# Patient Record
Sex: Female | Born: 1965 | Race: White | Hispanic: No | Smoking: Current every day smoker
Health system: Southern US, Community
[De-identification: ages and names within clinical notes are randomized; demographics above are authoritative.]

## PROBLEM LIST (undated history)

## (undated) DIAGNOSIS — K859 Acute pancreatitis without necrosis or infection, unspecified: Secondary | ICD-10-CM

## (undated) DIAGNOSIS — F32A Depression, unspecified: Secondary | ICD-10-CM

## (undated) DIAGNOSIS — I10 Essential (primary) hypertension: Secondary | ICD-10-CM

## (undated) DIAGNOSIS — J45909 Unspecified asthma, uncomplicated: Secondary | ICD-10-CM

## (undated) DIAGNOSIS — M797 Fibromyalgia: Secondary | ICD-10-CM

## (undated) DIAGNOSIS — F329 Major depressive disorder, single episode, unspecified: Secondary | ICD-10-CM

## (undated) DIAGNOSIS — F4310 Post-traumatic stress disorder, unspecified: Secondary | ICD-10-CM

## (undated) HISTORY — PX: OTHER SURGICAL HISTORY: SHX169

## (undated) HISTORY — PX: BREAST SURGERY: SHX581

## (undated) HISTORY — PX: ABDOMINAL HYSTERECTOMY: SHX81

---

## 2007-03-22 ENCOUNTER — Emergency Department (HOSPITAL_COMMUNITY): Admission: EM | Admit: 2007-03-22 | Discharge: 2007-03-22 | Payer: Self-pay | Admitting: Emergency Medicine

## 2011-12-07 DIAGNOSIS — R609 Edema, unspecified: Secondary | ICD-10-CM

## 2012-10-22 ENCOUNTER — Encounter (HOSPITAL_COMMUNITY): Payer: Self-pay | Admitting: *Deleted

## 2012-10-22 ENCOUNTER — Emergency Department (HOSPITAL_COMMUNITY)
Admission: EM | Admit: 2012-10-22 | Discharge: 2012-10-23 | Disposition: A | Discharge status: Home / Self Care | Payer: BC Managed Care – PPO | Attending: Emergency Medicine | Admitting: Emergency Medicine

## 2012-10-22 DIAGNOSIS — W57XXXA Bitten or stung by nonvenomous insect and other nonvenomous arthropods, initial encounter: Secondary | ICD-10-CM

## 2012-10-22 DIAGNOSIS — R21 Rash and other nonspecific skin eruption: Secondary | ICD-10-CM | POA: Insufficient documentation

## 2012-10-22 DIAGNOSIS — F3289 Other specified depressive episodes: Secondary | ICD-10-CM | POA: Insufficient documentation

## 2012-10-22 DIAGNOSIS — Y9389 Activity, other specified: Secondary | ICD-10-CM | POA: Insufficient documentation

## 2012-10-22 DIAGNOSIS — F17200 Nicotine dependence, unspecified, uncomplicated: Secondary | ICD-10-CM | POA: Insufficient documentation

## 2012-10-22 DIAGNOSIS — I10 Essential (primary) hypertension: Secondary | ICD-10-CM | POA: Insufficient documentation

## 2012-10-22 DIAGNOSIS — F329 Major depressive disorder, single episode, unspecified: Secondary | ICD-10-CM | POA: Insufficient documentation

## 2012-10-22 DIAGNOSIS — R509 Fever, unspecified: Secondary | ICD-10-CM | POA: Insufficient documentation

## 2012-10-22 DIAGNOSIS — R197 Diarrhea, unspecified: Secondary | ICD-10-CM | POA: Insufficient documentation

## 2012-10-22 DIAGNOSIS — Y929 Unspecified place or not applicable: Secondary | ICD-10-CM | POA: Insufficient documentation

## 2012-10-22 DIAGNOSIS — R109 Unspecified abdominal pain: Secondary | ICD-10-CM | POA: Insufficient documentation

## 2012-10-22 DIAGNOSIS — S90569A Insect bite (nonvenomous), unspecified ankle, initial encounter: Secondary | ICD-10-CM | POA: Insufficient documentation

## 2012-10-22 DIAGNOSIS — Z79899 Other long term (current) drug therapy: Secondary | ICD-10-CM | POA: Insufficient documentation

## 2012-10-22 DIAGNOSIS — R51 Headache: Secondary | ICD-10-CM | POA: Insufficient documentation

## 2012-10-22 DIAGNOSIS — R112 Nausea with vomiting, unspecified: Secondary | ICD-10-CM | POA: Insufficient documentation

## 2012-10-22 HISTORY — DX: Major depressive disorder, single episode, unspecified: F32.9

## 2012-10-22 HISTORY — DX: Depression, unspecified: F32.A

## 2012-10-22 HISTORY — DX: Essential (primary) hypertension: I10

## 2012-10-22 LAB — CBC WITH DIFFERENTIAL/PLATELET
Basophils Relative: 0 % (ref 0–1)
HCT: 32.4 % — ABNORMAL LOW (ref 36.0–46.0)
Hemoglobin: 11.2 g/dL — ABNORMAL LOW (ref 12.0–15.0)
Lymphs Abs: 3.2 10*3/uL (ref 0.7–4.0)
MCH: 29.6 pg (ref 26.0–34.0)
MCHC: 34.6 g/dL (ref 30.0–36.0)
Monocytes Absolute: 0.6 10*3/uL (ref 0.1–1.0)
Monocytes Relative: 8 % (ref 3–12)
Neutro Abs: 3.4 10*3/uL (ref 1.7–7.7)

## 2012-10-22 LAB — COMPREHENSIVE METABOLIC PANEL
Albumin: 4 g/dL (ref 3.5–5.2)
BUN: 13 mg/dL (ref 6–23)
Chloride: 92 mEq/L — ABNORMAL LOW (ref 96–112)
Creatinine, Ser: 0.72 mg/dL (ref 0.50–1.10)
GFR calc Af Amer: >90 mL/min (ref >90)
GFR calc non Af Amer: >90 mL/min (ref >90)
Glucose, Bld: 95 mg/dL (ref 70–99)
Total Bilirubin: 0.2 mg/dL — ABNORMAL LOW (ref 0.3–1.2)

## 2012-10-22 MED ORDER — KETOROLAC TROMETHAMINE 30 MG/ML IJ SOLN
30.0000 mg | Freq: Once | INTRAMUSCULAR | Status: AC
  Administered 2012-10-22: 30 mg via INTRAVENOUS
  Filled 2012-10-22: qty 1

## 2012-10-22 MED ORDER — SODIUM CHLORIDE 0.9 % IV BOLUS (SEPSIS)
1000.0000 mL | Freq: Once | INTRAVENOUS | Status: AC
  Administered 2012-10-22: 1000 mL via INTRAVENOUS

## 2012-10-22 MED ORDER — ONDANSETRON HCL 4 MG/2ML IJ SOLN
4.0000 mg | Freq: Once | INTRAMUSCULAR | Status: AC
  Administered 2012-10-22: 4 mg via INTRAVENOUS
  Filled 2012-10-22: qty 2

## 2012-10-22 NOTE — ED Notes (Signed)
Pt states she has n/v/d/ and headache, rash on legs and says she has gotten "80" deer ticks off of her in a period of 3 wks.

## 2012-10-22 NOTE — ED Provider Notes (Signed)
History    This chart was scribed for Kendra Lyons, MD by Toya Smothers, ED Scribe. The patient was seen in room APA05/APA05. Patient's care was started at 2128.  CSN: [id number]  Arrival date & time 10/22/12  2128   First MD Initiated Contact with Patient 10/22/12 2305      Chief Complaint  Patient presents with  . Headache  . Nausea  . Emesis  . Diarrhea  . Rash  . Insect Bite  . Abdominal Pain   Patient is a 47 y.o. female presenting with headaches, vomiting, diarrhea, rash, and abdominal pain. The history is provided by the patient. No language interpreter was used.  Headache Associated symptoms: abdominal pain, diarrhea, fever, nausea and vomiting   Emesis Associated symptoms: abdominal pain, diarrhea and headaches   Diarrhea Associated symptoms: abdominal pain, fever, headaches and vomiting   Rash Associated symptoms: diarrhea, fever, nausea and vomiting   Abdominal Pain Associated symptoms include abdominal pain and headaches.    HPI Comments:  Kendra Cox is a 47 y.o. female with h/o HTN and depression, who presents to the Emergency Department complaining of new gradual onset, constant, progressive rash to lower extremities and shoulders. Pt states that she removed "80 ticks from her skin." Pt now c/o 1 week of gradual onset, constant, moderate nausea, vomiting, diarrhea, and generalized diffuse abdominal pain. She also complains of Fever 103.6 yesterday. Pt denotes exposure to wooded area behind her mothers house. Pt denies headache, diaphoresis,weakness, cough, SOB and any other pain. Pt is a current everyday smoker, denying alcohol and illicit drug use. Pt lives with sister.    Past Medical History  Diagnosis Date  . Hypertension   . Depression     Past Surgical History  Procedure Laterality Date  . Abdominal hysterectomy    . Thumb surgery      No family history on file.  History  Substance Use Topics  . Smoking status: Current Every Day Smoker  .  Smokeless tobacco: Not on file  . Alcohol Use: No    Review of Systems  Constitutional: Positive for fever.  Gastrointestinal: Positive for nausea, vomiting, abdominal pain and diarrhea.  Skin: Positive for rash.  Neurological: Positive for headaches.  All other systems reviewed and are negative.    Allergies  Sulfa antibiotics  Home Medications   Current Outpatient Rx  Name  Route  Sig  Dispense  Refill  . ALPRAZolam (XANAX) 1 MG tablet   Oral   Take 1 mg by mouth daily as needed for sleep or anxiety.         Marland Kitchen BIOTIN PO   Oral   Take 1 capsule by mouth at bedtime.         . Calcium Carbonate-Vitamin D (CALCIUM + D PO)   Oral   Take 1 tablet by mouth at bedtime.         Marland Kitchen LISINOPRIL-HYDROCHLOROTHIAZIDE PO   Oral   Take 1 tablet by mouth every morning.         . venlafaxine XR (EFFEXOR-XR) 150 MG 24 hr capsule   Oral   Take 150 mg by mouth every morning.         . venlafaxine XR (EFFEXOR-XR) 75 MG 24 hr capsule   Oral   Take 75 mg by mouth every morning.           BP 134/90  Pulse 94  Temp(Src) 98.4 F (36.9 C) (Oral)  Resp 16  Ht 5\' 4"  (1.626  m)  Wt 129 lb (58.514 kg)  BMI 22.13 kg/m2  SpO2 98%  Physical Exam  Nursing note and vitals reviewed. Constitutional: She is oriented to person, place, and time. She appears well-developed and well-nourished. No distress.  HENT:  Head: Normocephalic and atraumatic.  Mouth/Throat: No oropharyngeal exudate.  Eyes: Conjunctivae and EOM are normal. Pupils are equal, round, and reactive to light. Right eye exhibits no discharge. Left eye exhibits no discharge.  Neck: Normal range of motion. Neck supple. No tracheal deviation present.  Cardiovascular: Normal rate.   Pulmonary/Chest: Effort normal. No respiratory distress. She has no wheezes.  Abdominal: Soft. She exhibits no distension. There is no tenderness.  Musculoskeletal: Normal range of motion. She exhibits no edema.  Lymphadenopathy:    She has  no cervical adenopathy.  Neurological: She is alert and oriented to person, place, and time. No cranial nerve deficit.  Skin: Skin is warm and dry.  There are numerous lesions to both lower extremities. These appear to be insect bites.  Psychiatric: She has a normal mood and affect. Her behavior is normal.    ED Course  Procedures  DIAGNOSTIC STUDIES: Oxygen Saturation is 98% on room air, normal by my interpretation.    COORDINATION OF CARE: 23:08- Evaluated Pt. Pt is awake, alert, and without distress. 23:12- Patient understands and agrees with initial ED impression and plan with expectations set for ED visit.   Labs Reviewed  CBC WITH DIFFERENTIAL - Abnormal; Notable for the following:    RBC 3.79 (*)    Hemoglobin 11.2 (*)    HCT 32.4 (*)    All other components within normal limits  COMPREHENSIVE METABOLIC PANEL  B. BURGDORFI ANTIBODIES   No results found.   No diagnosis found.    MDM  The patient insists the numerous small lesions on the legs are tick bites and that she pulled this number of ticks off of her.  These do not appear petechial and I am unable to find any residual ticks in any of these lesions.  I will prescribe her doxycycline due to the number of bites and have ordered Lymes titers.  She will be discharged to home.        I personally performed the services described in this documentation, which was scribed in my presence. The recorded information has been reviewed and is accurate.      Kendra Lyons, MD 10/23/12 [phone]

## 2012-10-23 MED ORDER — DOXYCYCLINE HYCLATE 100 MG PO CAPS: 100.0000 mg | ORAL_CAPSULE | Freq: Two times a day (BID) | ORAL | Status: DC

## 2012-10-23 NOTE — ED Notes (Signed)
Patient states she feels better and is requesting to go home. Advised MD.

## 2012-10-23 NOTE — ED Notes (Signed)
Patient has had no vomiting since arrival to ED. Gave patient ginger ale to drink as requested.

## 2012-10-25 LAB — B. BURGDORFI ANTIBODIES: B burgdorferi Ab IgG+IgM: 0.22 {ISR}

## 2013-01-17 ENCOUNTER — Emergency Department (HOSPITAL_COMMUNITY)
Admission: EM | Admit: 2013-01-17 | Discharge: 2013-01-17 | Disposition: A | Discharge status: Home / Self Care | Payer: BC Managed Care – PPO | Attending: Emergency Medicine | Admitting: Emergency Medicine

## 2013-01-17 ENCOUNTER — Encounter (HOSPITAL_COMMUNITY): Payer: Self-pay | Admitting: *Deleted

## 2013-01-17 DIAGNOSIS — R52 Pain, unspecified: Secondary | ICD-10-CM | POA: Insufficient documentation

## 2013-01-17 DIAGNOSIS — F329 Major depressive disorder, single episode, unspecified: Secondary | ICD-10-CM | POA: Insufficient documentation

## 2013-01-17 DIAGNOSIS — I10 Essential (primary) hypertension: Secondary | ICD-10-CM | POA: Insufficient documentation

## 2013-01-17 DIAGNOSIS — Z79899 Other long term (current) drug therapy: Secondary | ICD-10-CM | POA: Insufficient documentation

## 2013-01-17 DIAGNOSIS — G8929 Other chronic pain: Secondary | ICD-10-CM | POA: Insufficient documentation

## 2013-01-17 DIAGNOSIS — R51 Headache: Secondary | ICD-10-CM | POA: Insufficient documentation

## 2013-01-17 DIAGNOSIS — R42 Dizziness and giddiness: Secondary | ICD-10-CM | POA: Insufficient documentation

## 2013-01-17 DIAGNOSIS — Z760 Encounter for issue of repeat prescription: Secondary | ICD-10-CM | POA: Insufficient documentation

## 2013-01-17 DIAGNOSIS — F3289 Other specified depressive episodes: Secondary | ICD-10-CM | POA: Insufficient documentation

## 2013-01-17 DIAGNOSIS — F17200 Nicotine dependence, unspecified, uncomplicated: Secondary | ICD-10-CM | POA: Insufficient documentation

## 2013-01-17 MED ORDER — LORAZEPAM 1 MG PO TABS: ORAL_TABLET | ORAL | Status: DC

## 2013-01-17 MED ORDER — LISINOPRIL-HYDROCHLOROTHIAZIDE 10-12.5 MG PO TABS: 1.0000 | ORAL_TABLET | Freq: Every day | ORAL | Status: DC
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
## 2013-01-17 NOTE — ED Provider Notes (Signed)
CSN: [id number]     Arrival date & time 01/17/13  1312 History   First MD Initiated Contact with Patient 01/17/13 1343     Chief Complaint  Patient presents with  . Medication Refill    HPI Patient presents for medication refill She reports her physician lost license and can not get meds refilled No fever/vomiting, but she reports body aches and headaches and dizziness   Past Medical History  Diagnosis Date  . Hypertension   . Depression    Past Surgical History  Procedure Laterality Date  . Abdominal hysterectomy    . Thumb surgery    . Breast surgery     History reviewed. No pertinent family history. History  Substance Use Topics  . Smoking status: Current Every Day Smoker  . Smokeless tobacco: Not on file  . Alcohol Use: No   OB History   Grav Para Term Preterm Abortions TAB SAB Ect Mult Living                 Review of Systems  Constitutional: Negative for fever.  Gastrointestinal: Negative for vomiting.    Allergies  Sulfa antibiotics  Home Medications   Current Outpatient Rx  Name  Route  Sig  Dispense  Refill  . ALPRAZolam (XANAX) 1 MG tablet   Oral   Take 1 mg by mouth daily as needed for sleep or anxiety.         Marland Kitchen BIOTIN PO   Oral   Take 1 capsule by mouth at bedtime.         . Calcium Carbonate-Vitamin D (CALCIUM + D PO)   Oral   Take 1 tablet by mouth at bedtime.         Marland Kitchen doxycycline (VIBRAMYCIN) 100 MG capsule   Oral   Take 1 capsule (100 mg total) by mouth 2 (two) times daily.   28 capsule   0   . lisinopril-hydrochlorothiazide (PRINZIDE) 10-12.5 MG per tablet   Oral   Take 1 tablet by mouth daily.   14 tablet   0   . LISINOPRIL-HYDROCHLOROTHIAZIDE PO   Oral   Take 1 tablet by mouth every morning.         Marland Kitchen LORazepam (ATIVAN) 1 MG tablet      Take one tablet three times a day for 2 days, then take one tablet twice a day for 2 days, then take one tablet daily for 2 days then stop   12 tablet   0   . venlafaxine XR  (EFFEXOR-XR) 150 MG 24 hr capsule   Oral   Take 150 mg by mouth every morning.         . venlafaxine XR (EFFEXOR-XR) 75 MG 24 hr capsule   Oral   Take 75 mg by mouth every morning.          BP 134/87  Pulse 87  Temp(Src) 98.2 F (36.8 C) (Oral)  Resp 17  Ht 5\' 4"  (1.626 m)  Wt 129 lb (58.514 kg)  BMI 22.13 kg/m2  SpO2 94% Physical Exam CONSTITUTIONAL: Well developed/well nourished, pt wearing sunglasses HEAD: Normocephalic/atraumatic EYES: EOMI/PERRL ENMT: Mucous membranes moist NECK: supple no meningeal signs SPINE:entire spine nontender CV: S1/S2 noted, no murmurs/rubs/gallops noted LUNGS: Lungs are clear to auscultation bilaterally, no apparent distress ABDOMEN: soft, nontender, no rebound or guarding NEURO: Pt is awake/alert, moves all extremitiesx4, no arm/leg drift.  No ataxia EXTREMITIES: pulses normal, full ROM SKIN: warm, color normal PSYCH: no abnormalities of mood  noted  ED Course  Procedures (including critical care time)   MDM   1. Chronic pain    Nursing notes including past medical history and social history reviewed and considered in documentation Narcotic database reviewed  Will give short course of ativan to taper to prevent withdrawal Also her HTN meds were prescribed Advised that emergency department can not refill her chronic pain meds     Joya Gaskins, MD 01/17/13 1454

## 2015-03-26 ENCOUNTER — Emergency Department (HOSPITAL_COMMUNITY)
Admission: EM | Admit: 2015-03-26 | Discharge: 2015-03-26 | Disposition: A | Discharge status: Home / Self Care | Payer: Self-pay | Attending: Emergency Medicine | Admitting: Emergency Medicine

## 2015-03-26 ENCOUNTER — Encounter (HOSPITAL_COMMUNITY): Payer: Self-pay | Admitting: Emergency Medicine

## 2015-03-26 ENCOUNTER — Emergency Department (HOSPITAL_COMMUNITY): Payer: Self-pay

## 2015-03-26 DIAGNOSIS — Z79899 Other long term (current) drug therapy: Secondary | ICD-10-CM | POA: Insufficient documentation

## 2015-03-26 DIAGNOSIS — Y998 Other external cause status: Secondary | ICD-10-CM | POA: Insufficient documentation

## 2015-03-26 DIAGNOSIS — M549 Dorsalgia, unspecified: Secondary | ICD-10-CM

## 2015-03-26 DIAGNOSIS — I10 Essential (primary) hypertension: Secondary | ICD-10-CM | POA: Insufficient documentation

## 2015-03-26 DIAGNOSIS — L0231 Cutaneous abscess of buttock: Secondary | ICD-10-CM | POA: Insufficient documentation

## 2015-03-26 DIAGNOSIS — S0990XA Unspecified injury of head, initial encounter: Secondary | ICD-10-CM | POA: Insufficient documentation

## 2015-03-26 DIAGNOSIS — S3991XA Unspecified injury of abdomen, initial encounter: Secondary | ICD-10-CM | POA: Insufficient documentation

## 2015-03-26 DIAGNOSIS — Z720 Tobacco use: Secondary | ICD-10-CM | POA: Insufficient documentation

## 2015-03-26 DIAGNOSIS — Y9389 Activity, other specified: Secondary | ICD-10-CM | POA: Insufficient documentation

## 2015-03-26 DIAGNOSIS — F329 Major depressive disorder, single episode, unspecified: Secondary | ICD-10-CM | POA: Insufficient documentation

## 2015-03-26 DIAGNOSIS — S3992XA Unspecified injury of lower back, initial encounter: Secondary | ICD-10-CM | POA: Insufficient documentation

## 2015-03-26 DIAGNOSIS — R197 Diarrhea, unspecified: Secondary | ICD-10-CM | POA: Insufficient documentation

## 2015-03-26 DIAGNOSIS — Y92410 Unspecified street and highway as the place of occurrence of the external cause: Secondary | ICD-10-CM | POA: Insufficient documentation

## 2015-03-26 LAB — CBC WITH DIFFERENTIAL/PLATELET
BASOS PCT: 0 %
Basophils Absolute: 0 10*3/uL (ref 0.0–0.1)
EOS ABS: 0.2 10*3/uL (ref 0.0–0.7)
Eosinophils Relative: 3 %
HEMATOCRIT: 32.6 % — AB (ref 36.0–46.0)
Hemoglobin: 11 g/dL — ABNORMAL LOW (ref 12.0–15.0)
Lymphocytes Relative: 29 %
Lymphs Abs: 2.4 10*3/uL (ref 0.7–4.0)
MCH: 29.9 pg (ref 26.0–34.0)
MCHC: 33.7 g/dL (ref 30.0–36.0)
MCV: 88.6 fL (ref 78.0–100.0)
MONO ABS: 0.6 10*3/uL (ref 0.1–1.0)
MONOS PCT: 7 %
NEUTROS ABS: 5.2 10*3/uL (ref 1.7–7.7)
Neutrophils Relative %: 61 %
Platelets: 300 10*3/uL (ref 150–400)
RBC: 3.68 MIL/uL — ABNORMAL LOW (ref 3.87–5.11)
RDW: 13.5 % (ref 11.5–15.5)
WBC: 8.5 10*3/uL (ref 4.0–10.5)

## 2015-03-26 LAB — BASIC METABOLIC PANEL
Anion gap: 7 (ref 5–15)
BUN: 7 mg/dL (ref 6–20)
CALCIUM: 9.1 mg/dL (ref 8.9–10.3)
CO2: 29 mmol/L (ref 22–32)
CREATININE: 0.64 mg/dL (ref 0.44–1.00)
Chloride: 99 mmol/L — ABNORMAL LOW (ref 101–111)
GFR calc non Af Amer: >60 mL/min (ref >60)
Glucose, Bld: 93 mg/dL (ref 65–99)
Potassium: 3.8 mmol/L (ref 3.5–5.1)
Sodium: 135 mmol/L (ref 135–145)

## 2015-03-26 LAB — HEPATIC FUNCTION PANEL
ALT: 18 U/L (ref 14–54)
AST: 22 U/L (ref 15–41)
Albumin: 3.8 g/dL (ref 3.5–5.0)
Alkaline Phosphatase: 65 U/L (ref 38–126)
BILIRUBIN INDIRECT: 0.2 mg/dL — AB (ref 0.3–0.9)
Bilirubin, Direct: 0.1 mg/dL (ref 0.1–0.5)
Total Bilirubin: 0.3 mg/dL (ref 0.3–1.2)
Total Protein: 6.2 g/dL — ABNORMAL LOW (ref 6.5–8.1)

## 2015-03-26 MED ORDER — METHOCARBAMOL 500 MG PO TABS: 500.0000 mg | ORAL_TABLET | Freq: Three times a day (TID) | ORAL | Status: DC

## 2015-03-26 MED ORDER — LIDOCAINE HCL (PF) 2 % IJ SOLN
INTRAMUSCULAR | Status: AC
  Filled 2015-03-26: qty 10

## 2015-03-26 MED ORDER — CLINDAMYCIN HCL 150 MG PO CAPS: 300.0000 mg | ORAL_CAPSULE | Freq: Four times a day (QID) | ORAL | Status: DC

## 2015-03-26 MED ORDER — OXYCODONE-ACETAMINOPHEN 5-325 MG PO TABS: 1.0000 | ORAL_TABLET | ORAL | Status: DC | PRN

## 2015-03-26 MED ORDER — OXYCODONE-ACETAMINOPHEN 5-325 MG PO TABS
1.0000 | ORAL_TABLET | Freq: Once | ORAL | Status: AC
  Administered 2015-03-26: 1 via ORAL
  Filled 2015-03-26: qty 1

## 2015-03-26 NOTE — Discharge Instructions (Signed)
Abscess An abscess (boil or furuncle) is an infected area on or under the skin. This area is filled with yellowish-white fluid (pus) and other material (debris). HOME CARE   Only take medicines as told by your doctor.  If you were given antibiotic medicine, take it as directed. Finish the medicine even if you start to feel better.  If gauze is used, follow your doctor's directions for changing the gauze.  To avoid spreading the infection:  Keep your abscess covered with a bandage.  Wash your hands well.  Do not share personal care items, towels, or whirlpools with others.  Avoid skin contact with others.  Keep your skin and clothes clean around the abscess.  Keep all doctor visits as told. GET HELP RIGHT AWAY IF:   You have more pain, puffiness (swelling), or redness in the wound site.  You have more fluid or blood coming from the wound site.  You have muscle aches, chills, or you feel sick.  You have a fever. MAKE SURE YOU:   Understand these instructions.  Will watch your condition.  Will get help right away if you are not doing well or get worse.   This information is not intended to replace advice given to you by your health care provider. Make sure you discuss any questions you have with your health care provider.   Document Released: 10/22/2007 Document Revised: 11/04/2011 Document Reviewed: 07/19/2011 Elsevier Interactive Patient Education 2016 Elsevier Inc.  Back Pain, Adult Back pain is very common in adults.The cause of back pain is rarely dangerous and the pain often gets better over time.The cause of your back pain may not be known. Some common causes of back pain include:  Strain of the muscles or ligaments supporting the spine.  Wear and tear (degeneration) of the spinal disks.  Arthritis.  Direct injury to the back. For many people, back pain may return. Since back pain is rarely dangerous, most people can learn to manage this condition on  their own. HOME CARE INSTRUCTIONS Watch your back pain for any changes. The following actions may help to lessen any discomfort you are feeling:  Remain active. It is stressful on your back to sit or stand in one place for long periods of time. Do not sit, drive, or stand in one place for more than 30 minutes at a time. Take short walks on even surfaces as soon as you are able.Try to increase the length of time you walk each day.  Exercise regularly as directed by your health care provider. Exercise helps your back heal faster. It also helps avoid future injury by keeping your muscles strong and flexible.  Do not stay in bed.Resting more than 1-2 days can delay your recovery.  Pay attention to your body when you bend and lift. The most comfortable positions are those that put less stress on your recovering back. Always use proper lifting techniques, including:  Bending your knees.  Keeping the load close to your body.  Avoiding twisting.  Find a comfortable position to sleep. Use a firm mattress and lie on your side with your knees slightly bent. If you lie on your back, put a pillow under your knees.  Avoid feeling anxious or stressed.Stress increases muscle tension and can worsen back pain.It is important to recognize when you are anxious or stressed and learn ways to manage it, such as with exercise.  Take medicines only as directed by your health care provider. Over-the-counter medicines to reduce pain and inflammation  are often the most helpful.Your health care provider may prescribe muscle relaxant drugs.These medicines help dull your pain so you can more quickly return to your normal activities and healthy exercise.  Apply ice to the injured area:  Put ice in a plastic bag.  Place a towel between your skin and the bag.  Leave the ice on for 20 minutes, 2-3 times a day for the first 2-3 days. After that, ice and heat may be alternated to reduce pain and spasms.  Maintain a  healthy weight. Excess weight puts extra stress on your back and makes it difficult to maintain good posture. SEEK MEDICAL CARE IF:  You have pain that is not relieved with rest or medicine.  You have increasing pain going down into the legs or buttocks.  You have pain that does not improve in one week.  You have night pain.  You lose weight.  You have a fever or chills. SEEK IMMEDIATE MEDICAL CARE IF:   You develop new bowel or bladder control problems.  You have unusual weakness or numbness in your arms or legs.  You develop nausea or vomiting.  You develop abdominal pain.  You feel faint.   This information is not intended to replace advice given to you by your health care provider. Make sure you discuss any questions you have with your health care provider.   Document Released: 05/05/2005 Document Revised: 05/26/2014 Document Reviewed: 09/06/2013 Elsevier Interactive Patient Education 2016 Elsevier Inc.   [facility] Doctor List    Kari Baars MD. Specialty: Pulmonary Disease Contact information: [address]  [address]  [place] [address]  [phone]   Syliva Overman, MD. Specialty: [facility] Medicine Contact information: [address]  [place] [address]  [phone]   Lilyan Punt, MD. Specialty: [facility] Medicine Contact information: [address]  [place] [address]  [phone]   Avon Gully, MD Specialty: Internal Medicine Contact information: [address] [place] [address]  [phone]   Catalina Pizza, MD. Specialty: Internal Medicine Contact information: [address]  [place] [address]  [phone]   Butch Penny, MD. Specialty: Family Medicine Contact information: [address]  [place] [address]  [phone]   John Giovanni, MD. Specialty: [facility] Medicine Contact information: [address]  [address]  [place] [address]  [phone]   Carylon Perches, MD. Specialty: Internal Medicine Contact information: [address]  [address]  [place] [address]  [phone]

## 2015-03-26 NOTE — ED Notes (Signed)
Pt up and independently ambulatory to bathroom.
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
## 2015-03-26 NOTE — ED Notes (Signed)
Pt in Radiology 

## 2015-06-07 ENCOUNTER — Emergency Department (HOSPITAL_COMMUNITY)
Admission: EM | Admit: 2015-06-07 | Discharge: 2015-06-07 | Disposition: A | Discharge status: Home / Self Care | Payer: Self-pay | Attending: Emergency Medicine | Admitting: Emergency Medicine

## 2015-06-07 ENCOUNTER — Encounter (HOSPITAL_COMMUNITY): Payer: Self-pay

## 2015-06-07 DIAGNOSIS — I10 Essential (primary) hypertension: Secondary | ICD-10-CM | POA: Insufficient documentation

## 2015-06-07 DIAGNOSIS — N12 Tubulo-interstitial nephritis, not specified as acute or chronic: Secondary | ICD-10-CM | POA: Insufficient documentation

## 2015-06-07 DIAGNOSIS — F329 Major depressive disorder, single episode, unspecified: Secondary | ICD-10-CM | POA: Insufficient documentation

## 2015-06-07 DIAGNOSIS — F17210 Nicotine dependence, cigarettes, uncomplicated: Secondary | ICD-10-CM | POA: Insufficient documentation

## 2015-06-07 DIAGNOSIS — Z79899 Other long term (current) drug therapy: Secondary | ICD-10-CM | POA: Insufficient documentation

## 2015-06-07 LAB — BASIC METABOLIC PANEL
ANION GAP: 9 (ref 5–15)
BUN: 6 mg/dL (ref 6–20)
CO2: 28 mmol/L (ref 22–32)
Calcium: 9.4 mg/dL (ref 8.9–10.3)
Chloride: 98 mmol/L — ABNORMAL LOW (ref 101–111)
Creatinine, Ser: 0.57 mg/dL (ref 0.44–1.00)
Glucose, Bld: 93 mg/dL (ref 65–99)
POTASSIUM: 3.6 mmol/L (ref 3.5–5.1)
SODIUM: 135 mmol/L (ref 135–145)

## 2015-06-07 LAB — CBC WITH DIFFERENTIAL/PLATELET
BASOS ABS: 0 10*3/uL (ref 0.0–0.1)
Basophils Relative: 0 %
EOS PCT: 2 %
Eosinophils Absolute: 0.3 10*3/uL (ref 0.0–0.7)
HCT: 34.3 % — ABNORMAL LOW (ref 36.0–46.0)
Hemoglobin: 11.4 g/dL — ABNORMAL LOW (ref 12.0–15.0)
LYMPHS PCT: 28 %
Lymphs Abs: 3.4 10*3/uL (ref 0.7–4.0)
MCH: 30 pg (ref 26.0–34.0)
MCHC: 33.2 g/dL (ref 30.0–36.0)
MCV: 90.3 fL (ref 78.0–100.0)
Monocytes Absolute: 0.6 10*3/uL (ref 0.1–1.0)
Monocytes Relative: 5 %
NEUTROS ABS: 7.7 10*3/uL (ref 1.7–7.7)
Neutrophils Relative %: 65 %
PLATELETS: 314 10*3/uL (ref 150–400)
RBC: 3.8 MIL/uL — AB (ref 3.87–5.11)
RDW: 13.3 % (ref 11.5–15.5)
WBC: 12.1 10*3/uL — AB (ref 4.0–10.5)

## 2015-06-07 LAB — URINE MICROSCOPIC-ADD ON

## 2015-06-07 LAB — URINALYSIS, ROUTINE W REFLEX MICROSCOPIC
Bilirubin Urine: NEGATIVE
Glucose, UA: NEGATIVE mg/dL
Ketones, ur: NEGATIVE mg/dL
NITRITE: NEGATIVE
PROTEIN: NEGATIVE mg/dL
Specific Gravity, Urine: <1.005 — ABNORMAL LOW (ref 1.005–1.030)
pH: 6.5 (ref 5.0–8.0)

## 2015-06-07 MED ORDER — ONDANSETRON 4 MG PO TBDP: 4.0000 mg | ORAL_TABLET | Freq: Three times a day (TID) | ORAL | Status: DC | PRN

## 2015-06-07 MED ORDER — ONDANSETRON HCL 4 MG/2ML IJ SOLN: 4.0000 mg | Freq: Once | INTRAMUSCULAR | Status: DC

## 2015-06-07 MED ORDER — MORPHINE SULFATE (PF) 2 MG/ML IV SOLN
4.0000 mg | Freq: Once | INTRAVENOUS | Status: AC
  Administered 2015-06-07: 4 mg via INTRAVENOUS
  Filled 2015-06-07: qty 2

## 2015-06-07 MED ORDER — OXYCODONE-ACETAMINOPHEN 5-325 MG PO TABS: 1.0000 | ORAL_TABLET | Freq: Four times a day (QID) | ORAL | Status: DC | PRN

## 2015-06-07 MED ORDER — CEFTRIAXONE SODIUM 1 G IJ SOLR
1.0000 g | Freq: Once | INTRAMUSCULAR | Status: AC
  Administered 2015-06-07: 1 g via INTRAVENOUS
  Filled 2015-06-07: qty 10

## 2015-06-07 MED ORDER — PHENAZOPYRIDINE HCL 100 MG PO TABS
100.0000 mg | ORAL_TABLET | Freq: Once | ORAL | Status: AC
  Administered 2015-06-07: 100 mg via ORAL
  Filled 2015-06-07: qty 1

## 2015-06-07 MED ORDER — ONDANSETRON HCL 4 MG/2ML IJ SOLN
4.0000 mg | Freq: Once | INTRAMUSCULAR | Status: AC
  Administered 2015-06-07: 4 mg via INTRAVENOUS
  Filled 2015-06-07: qty 2

## 2015-06-07 MED ORDER — OXYCODONE-ACETAMINOPHEN 5-325 MG PO TABS
1.0000 | ORAL_TABLET | Freq: Once | ORAL | Status: AC
  Administered 2015-06-07: 1 via ORAL
  Filled 2015-06-07: qty 1

## 2015-06-07 MED ORDER — KETOROLAC TROMETHAMINE 30 MG/ML IJ SOLN
30.0000 mg | Freq: Once | INTRAMUSCULAR | Status: AC
  Administered 2015-06-07: 30 mg via INTRAVENOUS
  Filled 2015-06-07: qty 1

## 2015-06-07 MED ORDER — CIPROFLOXACIN HCL 500 MG PO TABS: 500.0000 mg | ORAL_TABLET | Freq: Two times a day (BID) | ORAL | Status: DC

## 2015-06-07 MED ORDER — SODIUM CHLORIDE 0.9 % IV BOLUS (SEPSIS)
1000.0000 mL | Freq: Once | INTRAVENOUS | Status: AC
  Administered 2015-06-07: 1000 mL via INTRAVENOUS

## 2015-06-07 NOTE — ED Notes (Signed)
Pt states she is having burning with urination, frequency, pain in lower back and suprapubic area x 2 weeks, taking otc azo without relief

## 2015-06-07 NOTE — ED Provider Notes (Addendum)
CSN: [id number]     Arrival date & time 06/07/15  0014 History   First MD Initiated Contact with Patient 06/07/15 0103     Chief Complaint  Patient presents with  . uti symptoms      (Consider location/radiation/quality/duration/timing/severity/associated sxs/prior Treatment) HPI  This a 50 year old female with a history of urinary tract infection who presents with dysuria, hematuria, back pain. Reports symptoms for 2 weeks. Has been taking over-the-counter Azo without relief. Reports progressive worsening of symptoms. Bilateral back pain which is now 10 out of 10. She reports one-day history of nausea and vomiting. Reports chills without documented fevers. Denies any diarrhea. Reports suprapubic pressure as well as dysuria and hematuria.   Past Medical History  Diagnosis Date  . Hypertension   . Depression    Past Surgical History  Procedure Laterality Date  . Abdominal hysterectomy    . Thumb surgery    . Breast surgery     No family history on file. Social History  Substance Use Topics  . Smoking status: Current Every Day Smoker -- 1.00 packs/day    Types: Cigarettes  . Smokeless tobacco: None  . Alcohol Use: No   OB History    No data available     Review of Systems  Constitutional: Positive for chills. Negative for fever.  Respiratory: Negative for chest tightness and shortness of breath.   Cardiovascular: Negative for chest pain.  Gastrointestinal: Positive for nausea and vomiting. Negative for abdominal pain and diarrhea.  Genitourinary: Positive for dysuria, hematuria and flank pain.  Musculoskeletal: Positive for back pain.  All other systems reviewed and are negative.     Allergies  Sulfa antibiotics  Home Medications   Prior to Admission medications   Medication Sig Start Date End Date Taking? Authorizing Provider  ALPRAZolam Prudy Feeler) 1 MG tablet Take 1 mg by mouth daily as needed for sleep or anxiety.   Yes Historical Provider, MD  BIOTIN PO Take 1  capsule by mouth at bedtime.   Yes Historical Provider, MD  HYDROcodone-acetaminophen (NORCO/VICODIN) 5-325 MG tablet Take 1 tablet by mouth every 6 (six) hours as needed for moderate pain.   Yes Historical Provider, MD  lisinopril-hydrochlorothiazide (PRINZIDE) 10-12.5 MG per tablet Take 1 tablet by mouth daily. 01/17/13  Yes Zadie Rhine, MD  venlafaxine XR (EFFEXOR-XR) 150 MG 24 hr capsule Take 150 mg by mouth every morning.   Yes Historical Provider, MD  ciprofloxacin (CIPRO) 500 MG tablet Take 1 tablet (500 mg total) by mouth every 12 (twelve) hours. 06/07/15   Shon Baton, MD  clindamycin (CLEOCIN) 150 MG capsule Take 2 capsules (300 mg total) by mouth 4 (four) times daily. For 7 days 03/26/15   Tammy Triplett, PA-C  methocarbamol (ROBAXIN) 500 MG tablet Take 1 tablet (500 mg total) by mouth 3 (three) times daily. 03/26/15   Tammy Triplett, PA-C  ondansetron (ZOFRAN ODT) 4 MG disintegrating tablet Take 1 tablet (4 mg total) by mouth every 8 (eight) hours as needed for nausea or vomiting. 06/07/15   Shon Baton, MD  oxyCODONE-acetaminophen (PERCOCET/ROXICET) 5-325 MG tablet Take 1-2 tablets by mouth every 6 (six) hours as needed for severe pain. 06/07/15   Shon Baton, MD   BP 123/85 mmHg  Pulse 67  Temp(Src) 97.8 F (36.6 C) (Oral)  Resp 18  Ht  (1.575 m)  Wt 120 lb (54.432 kg)  BMI 21.94 kg/m2  SpO2 96% Physical Exam  Constitutional: She is oriented to person, place, and time.  She appears well-developed and well-nourished.  Uncomfortable appearing, no acute distress  HENT:  Head: Normocephalic and atraumatic.  Cardiovascular: Normal rate, regular rhythm and normal heart sounds.   Pulmonary/Chest: Effort normal and breath sounds normal. No respiratory distress. She has no wheezes.  Abdominal: Soft. Bowel sounds are normal. There is tenderness. There is no rebound.  Mild suprapubic tenderness to palpation without rebound or guarding  Genitourinary:  No CVA  tenderness  Neurological: She is alert and oriented to person, place, and time.  Skin: Skin is warm and dry.  Psychiatric: She has a normal mood and affect.  Nursing note and vitals reviewed.   ED Course  Procedures (including critical care time) Labs Review Labs Reviewed  URINALYSIS, ROUTINE W REFLEX MICROSCOPIC (NOT AT [facility]) - Abnormal; Notable for the following:    Color, Urine STRAW (*)    Specific Gravity, Urine <1.005 (*)    Hgb urine dipstick MODERATE (*)    Leukocytes, UA LARGE (*)    All other components within normal limits  BASIC METABOLIC PANEL - Abnormal; Notable for the following:    Chloride 98 (*)    All other components within normal limits  CBC WITH DIFFERENTIAL/PLATELET - Abnormal; Notable for the following:    WBC 12.1 (*)    RBC 3.80 (*)    Hemoglobin 11.4 (*)    HCT 34.3 (*)    All other components within normal limits  URINE MICROSCOPIC-ADD ON - Abnormal; Notable for the following:    Squamous Epithelial / LPF 0-5 (*)    Bacteria, UA MANY (*)    All other components within normal limits  URINE CULTURE    Imaging Review No results found. I have personally reviewed and evaluated these images and lab results as part of my medical decision-making.   EKG Interpretation None     Medications  sodium chloride 0.9 % bolus 1,000 mL (0 mLs Intravenous Stopped 06/07/15 0430)  phenazopyridine (PYRIDIUM) tablet 100 mg (100 mg Oral Given 06/07/15 0135)  morphine 2 MG/ML injection 4 mg (4 mg Intravenous Given 06/07/15 0135)  ondansetron (ZOFRAN) injection 4 mg (4 mg Intravenous Given 06/07/15 0135)  cefTRIAXone (ROCEPHIN) 1 g in dextrose 5 % 50 mL IVPB (0 g Intravenous Stopped 06/07/15 0343)  ketorolac (TORADOL) 30 MG/ML injection 30 mg (30 mg Intravenous Given 06/07/15 0243)  oxyCODONE-acetaminophen (PERCOCET/ROXICET) 5-325 MG per tablet 1 tablet (1 tablet Oral Given 06/07/15 0243)    MDM   Final diagnoses:  Pyelonephritis    Patient presents with urinary  symptoms 2 weeks. Reports bilateral back pain and suprapubic pressure. No CVA tenderness. Afebrile and nontoxic appearing. She does appear uncomfortable. Patient was given pain and nausea medication as well as fluids.  She has too numerous to count white cells in her urine and many bacteria. Urine culture was sent. Lab work is only notable for mild leukocytosis to 12.1. Back pain is nonlateralizing; however, given leukocytosis, would treat for pyelonephritis. Patient reports improvement after symptomatic control. She was given IV Rocephin and will be discharged home with ciprofloxacin. Given that she is nontoxic and otherwise well-appearing, feel she is appropriate for outpatient management. Discussed with patient strict return precautions including fevers, increasing pain or any new or worsening symptoms. Patient's stated understanding.  After history, exam, and medical workup I feel the patient has been appropriately medically screened and is safe for discharge home. Pertinent diagnoses were discussed with the patient. Patient was given return precautions.     Shon Baton, MD  06/07/15 [id number]  Shon Baton, MD 06/07/15 [phone]

## 2015-06-07 NOTE — Discharge Instructions (Signed)

## 2015-06-08 LAB — URINE CULTURE: CULTURE: NO GROWTH

## 2015-06-26 ENCOUNTER — Encounter (HOSPITAL_COMMUNITY): Payer: Self-pay | Admitting: Emergency Medicine

## 2015-06-26 ENCOUNTER — Emergency Department (HOSPITAL_COMMUNITY)
Admission: EM | Admit: 2015-06-26 | Discharge: 2015-06-26 | Disposition: A | Discharge status: Home / Self Care | Payer: Self-pay | Attending: Emergency Medicine | Admitting: Emergency Medicine

## 2015-06-26 DIAGNOSIS — Z79899 Other long term (current) drug therapy: Secondary | ICD-10-CM | POA: Insufficient documentation

## 2015-06-26 DIAGNOSIS — I10 Essential (primary) hypertension: Secondary | ICD-10-CM | POA: Insufficient documentation

## 2015-06-26 DIAGNOSIS — M791 Myalgia: Secondary | ICD-10-CM | POA: Insufficient documentation

## 2015-06-26 DIAGNOSIS — J40 Bronchitis, not specified as acute or chronic: Secondary | ICD-10-CM

## 2015-06-26 DIAGNOSIS — J069 Acute upper respiratory infection, unspecified: Secondary | ICD-10-CM

## 2015-06-26 DIAGNOSIS — F17210 Nicotine dependence, cigarettes, uncomplicated: Secondary | ICD-10-CM | POA: Insufficient documentation

## 2015-06-26 DIAGNOSIS — R51 Headache: Secondary | ICD-10-CM | POA: Insufficient documentation

## 2015-06-26 DIAGNOSIS — J209 Acute bronchitis, unspecified: Secondary | ICD-10-CM | POA: Insufficient documentation

## 2015-06-26 DIAGNOSIS — F329 Major depressive disorder, single episode, unspecified: Secondary | ICD-10-CM | POA: Insufficient documentation

## 2015-06-26 MED ORDER — DOXYCYCLINE HYCLATE 100 MG PO TABS
100.0000 mg | ORAL_TABLET | Freq: Once | ORAL | Status: AC
  Administered 2015-06-26: 100 mg via ORAL
  Filled 2015-06-26: qty 1

## 2015-06-26 MED ORDER — HYDROCOD POLST-CPM POLST ER 10-8 MG/5ML PO SUER
5.0000 mL | Freq: Once | ORAL | Status: AC
  Administered 2015-06-26: 5 mL via ORAL
  Filled 2015-06-26: qty 5

## 2015-06-26 MED ORDER — DEXAMETHASONE 4 MG PO TABS: 4.0000 mg | ORAL_TABLET | Freq: Two times a day (BID) | ORAL | Status: DC

## 2015-06-26 MED ORDER — HYDROCODONE-HOMATROPINE 5-1.5 MG/5ML PO SYRP: 5.0000 mL | ORAL_SOLUTION | Freq: Four times a day (QID) | ORAL | Status: DC | PRN

## 2015-06-26 MED ORDER — IBUPROFEN 800 MG PO TABS
800.0000 mg | ORAL_TABLET | Freq: Once | ORAL | Status: AC
  Administered 2015-06-26: 800 mg via ORAL
  Filled 2015-06-26: qty 1

## 2015-06-26 MED ORDER — DOXYCYCLINE HYCLATE 100 MG PO CAPS: 100.0000 mg | ORAL_CAPSULE | Freq: Two times a day (BID) | ORAL | Status: DC

## 2015-06-26 MED ORDER — PREDNISONE 50 MG PO TABS
50.0000 mg | ORAL_TABLET | Freq: Once | ORAL | Status: AC
  Administered 2015-06-26: 50 mg via ORAL
  Filled 2015-06-26: qty 1

## 2015-06-26 NOTE — Discharge Instructions (Signed)
Please increase fluids. Please wash hands frequently. Please use a mask until symptoms have resolved. Please use Tylenol every 4 hours, or ibuprofen every 6 hours over the next 2 or 3 days, then every 4 hours as needed. Use doxycycline and Decadron 2 times daily with food. Use Hycodan every 6 hours for cough and congestion. Viral Infections A virus is a type of germ. Viruses can cause:  Minor sore throats.  Aches and pains.  Headaches.  Runny nose.  Rashes.  Watery eyes.  Tiredness.  Coughs.  Loss of appetite.  Feeling sick to your stomach (nausea).  Throwing up (vomiting).  Watery poop (diarrhea). HOME CARE   Only take medicines as told by your doctor.  Drink enough water and fluids to keep your pee (urine) clear or pale yellow. Sports drinks are a good choice.  Get plenty of rest and eat healthy. Soups and broths with crackers or rice are fine. GET HELP RIGHT AWAY IF:   You have a very bad headache.  You have shortness of breath.  You have chest pain or neck pain.  You have an unusual rash.  You cannot stop throwing up.  You have watery poop that does not stop.  You cannot keep fluids down.  You or your child has a temperature by mouth above 102 F (38.9 C), not controlled by medicine.  Your baby is older than 3 months with a rectal temperature of 102 F (38.9 C) or higher.  Your baby is 30 months old or younger with a rectal temperature of 100.4 F (38 C) or higher. MAKE SURE YOU:   Understand these instructions.  Will watch this condition.  Will get help right away if you are not doing well or get worse.   This information is not intended to replace advice given to you by your health care provider. Make sure you discuss any questions you have with your health care provider.   Document Released: 04/17/2008 Document Revised: 07/28/2011 Document Reviewed: 10/11/2014 Elsevier Interactive Patient Education 2016 [employer]  Cough,  Adult Coughing is a reflex that clears your throat and your airways. Coughing helps to heal and protect your lungs. It is normal to cough occasionally, but a cough that happens with other symptoms or lasts a long time may be a sign of a condition that needs treatment. A cough may last only 2-3 weeks (acute), or it may last longer than 8 weeks (chronic). CAUSES Coughing is commonly caused by:  Breathing in substances that irritate your lungs.  A viral or bacterial respiratory infection.  Allergies.  Asthma.  Postnasal drip.  Smoking.  Acid backing up from the stomach into the esophagus (gastroesophageal reflux).  Certain medicines.  Chronic lung problems, including COPD (or rarely, lung cancer).  Other medical conditions such as heart failure. HOME CARE INSTRUCTIONS  Pay attention to any changes in your symptoms. Take these actions to help with your discomfort:  Take medicines only as told by your health care provider.  If you were prescribed an antibiotic medicine, take it as told by your health care provider. Do not stop taking the antibiotic even if you start to feel better.  Talk with your health care provider before you take a cough suppressant medicine.  Drink enough fluid to keep your urine clear or pale yellow.  If the air is dry, use a cold steam vaporizer or humidifier in your bedroom or your home to help loosen secretions.  Avoid anything that causes you to cough at  work or at home.  If your cough is worse at night, try sleeping in a semi-upright position.  Avoid cigarette smoke. If you smoke, quit smoking. If you need help quitting, ask your health care provider.  Avoid caffeine.  Avoid alcohol.  Rest as needed. SEEK MEDICAL CARE IF:   You have new symptoms.  You cough up pus.  Your cough does not get better after 2-3 weeks, or your cough gets worse.  You cannot control your cough with suppressant medicines and you are losing sleep.  You develop  pain that is getting worse or pain that is not controlled with pain medicines.  You have a fever.  You have unexplained weight loss.  You have night sweats. SEEK IMMEDIATE MEDICAL CARE IF:  You cough up blood.  You have difficulty breathing.  Your heartbeat is very fast.   This information is not intended to replace advice given to you by your health care provider. Make sure you discuss any questions you have with your health care provider.   Document Released: 11/01/2010 Document Revised: 01/24/2015 Document Reviewed: 07/12/2014 Elsevier Interactive Patient Education [employer]

## 2015-06-26 NOTE — ED Notes (Signed)
Aching all over, fever cough

## 2015-06-26 NOTE — ED Provider Notes (Signed)
CSN: [id number]     Arrival date & time 06/26/15  1456 History   First MD Initiated Contact with Patient 06/26/15 1542     No chief complaint on file.    (Consider location/radiation/quality/duration/timing/severity/associated sxs/prior Treatment) HPI Comments: Temp 104.3 Max.  Patient is a 50 y.o. female presenting with URI. The history is provided by the patient.  URI Presenting symptoms: congestion, cough, fatigue and fever   Severity:  Moderate Onset quality:  Gradual Duration:  1 day Timing:  Intermittent Progression:  Worsening Chronicity:  New Relieved by:  Nothing Ineffective treatments:  OTC medications Associated symptoms: headaches, myalgias and wheezing   Risk factors: sick contacts   Risk factors: no diabetes mellitus, no immunosuppression and no recent travel     Past Medical History  Diagnosis Date  . Hypertension   . Depression    Past Surgical History  Procedure Laterality Date  . Abdominal hysterectomy    . Thumb surgery    . Breast surgery     History reviewed. No pertinent family history. Social History  Substance Use Topics  . Smoking status: Current Every Day Smoker -- 1.00 packs/day    Types: Cigarettes  . Smokeless tobacco: None  . Alcohol Use: No   OB History    No data available     Review of Systems  Constitutional: Positive for fever, chills and fatigue.  HENT: Positive for congestion.   Respiratory: Positive for cough and wheezing.   Musculoskeletal: Positive for myalgias.  Skin: Negative for rash.  Neurological: Positive for headaches.      Allergies  Sulfa antibiotics  Home Medications   Prior to Admission medications   Medication Sig Start Date End Date Taking? Authorizing Provider  ALPRAZolam Prudy Feeler) 1 MG tablet Take 1 mg by mouth daily as needed for sleep or anxiety.    Historical Provider, MD  BIOTIN PO Take 1 capsule by mouth at bedtime.    Historical Provider, MD  ciprofloxacin (CIPRO) 500 MG tablet Take 1 tablet  (500 mg total) by mouth every 12 (twelve) hours. 06/07/15   Shon Baton, MD  clindamycin (CLEOCIN) 150 MG capsule Take 2 capsules (300 mg total) by mouth 4 (four) times daily. For 7 days 03/26/15   Tammy Triplett, PA-C  HYDROcodone-acetaminophen (NORCO/VICODIN) 5-325 MG tablet Take 1 tablet by mouth every 6 (six) hours as needed for moderate pain.    Historical Provider, MD  lisinopril-hydrochlorothiazide (PRINZIDE) 10-12.5 MG per tablet Take 1 tablet by mouth daily. 01/17/13   Zadie Rhine, MD  methocarbamol (ROBAXIN) 500 MG tablet Take 1 tablet (500 mg total) by mouth 3 (three) times daily. 03/26/15   Tammy Triplett, PA-C  ondansetron (ZOFRAN ODT) 4 MG disintegrating tablet Take 1 tablet (4 mg total) by mouth every 8 (eight) hours as needed for nausea or vomiting. 06/07/15   Shon Baton, MD  oxyCODONE-acetaminophen (PERCOCET/ROXICET) 5-325 MG tablet Take 1-2 tablets by mouth every 6 (six) hours as needed for severe pain. 06/07/15   Shon Baton, MD  venlafaxine XR (EFFEXOR-XR) 150 MG 24 hr capsule Take 150 mg by mouth every morning.    Historical Provider, MD   BP 131/96 mmHg  Pulse 101  Temp(Src) 100.3 F (37.9 C) (Oral)  Ht  (1.626 m)  Wt 56.7 kg  BMI 21.45 kg/m2  SpO2 99% Physical Exam  Constitutional: She is oriented to person, place, and time. She appears well-developed and well-nourished.  Non-toxic appearance.  HENT:  Head: Normocephalic.  Right Ear: Tympanic  membrane and external ear normal.  Left Ear: Tympanic membrane and external ear normal.  Eyes: EOM and lids are normal. Pupils are equal, round, and reactive to light.  Neck: Normal range of motion. Neck supple. Carotid bruit is not present.  Cardiovascular: Normal rate, regular rhythm, normal heart sounds, intact distal pulses and normal pulses.   Pulmonary/Chest: Breath sounds normal. No respiratory distress.  Abdominal: Soft. Bowel sounds are normal. There is no tenderness. There is no guarding.   Musculoskeletal: Normal range of motion.  Lymphadenopathy:       Head (right side): No submandibular adenopathy present.       Head (left side): No submandibular adenopathy present.    She has no cervical adenopathy.  Neurological: She is alert and oriented to person, place, and time. She has normal strength. No cranial nerve deficit or sensory deficit.  Skin: Skin is warm and dry.  Psychiatric: She has a normal mood and affect. Her speech is normal.  Nursing note and vitals reviewed.   ED Course  Procedures (including critical care time) Labs Review Labs Reviewed - No data to display  Imaging Review No results found. I have personally reviewed and evaluated these images and lab results as part of my medical decision-making.   EKG Interpretation None      MDM  Vital signs reviewed. The examination favors bronchitis and upper respiratory infection. It is of note that the patient is a smoker, and that she has family members who are sick. The patient is in no distress. The plan at this time is for the patient to be placed on Decadron, doxycycline, and Hycodan. We discussed the importance of adequate hydration, good handwashing, and use of mask until symptoms have resolved. The patient acknowledges understanding of these discharge instructions. She will follow with her primary physician, or return to the emergency department if not improving.    Final diagnoses:  Bronchitis  URI (upper respiratory infection)    *I have reviewed nursing notes, vital signs, and all appropriate lab and imaging results for this patient.Ivery Quale, PA-C 06/26/15 2129  Bethann Berkshire, MD 06/26/15 [phone]

## 2015-08-06 ENCOUNTER — Emergency Department (HOSPITAL_COMMUNITY)
Admission: EM | Admit: 2015-08-06 | Discharge: 2015-08-06 | Disposition: A | Discharge status: Home / Self Care | Payer: No Typology Code available for payment source | Attending: Emergency Medicine | Admitting: Emergency Medicine

## 2015-08-06 ENCOUNTER — Emergency Department (HOSPITAL_COMMUNITY): Payer: No Typology Code available for payment source

## 2015-08-06 ENCOUNTER — Encounter (HOSPITAL_COMMUNITY): Payer: Self-pay | Admitting: Emergency Medicine

## 2015-08-06 DIAGNOSIS — R062 Wheezing: Secondary | ICD-10-CM | POA: Insufficient documentation

## 2015-08-06 DIAGNOSIS — F17210 Nicotine dependence, cigarettes, uncomplicated: Secondary | ICD-10-CM | POA: Insufficient documentation

## 2015-08-06 DIAGNOSIS — M25511 Pain in right shoulder: Secondary | ICD-10-CM | POA: Insufficient documentation

## 2015-08-06 DIAGNOSIS — F329 Major depressive disorder, single episode, unspecified: Secondary | ICD-10-CM | POA: Insufficient documentation

## 2015-08-06 DIAGNOSIS — I10 Essential (primary) hypertension: Secondary | ICD-10-CM | POA: Insufficient documentation

## 2015-08-06 MED ORDER — HYDROCODONE-ACETAMINOPHEN 5-325 MG PO TABS: 2.0000 | ORAL_TABLET | ORAL | Status: DC | PRN

## 2015-08-06 MED ORDER — PREDNISONE 10 MG PO TABS
ORAL_TABLET | ORAL | Status: DC
Start: 2015-08-06 — End: 2015-12-25

## 2015-08-06 MED ORDER — ALBUTEROL SULFATE HFA 108 (90 BASE) MCG/ACT IN AERS: 1.0000 | INHALATION_SPRAY | Freq: Four times a day (QID) | RESPIRATORY_TRACT | Status: AC | PRN

## 2015-08-06 NOTE — ED Notes (Signed)
Pt reports RT shoulder pain that radiates down RT arm. States she fell out of a truck a few months ago. Reports taking OTC medication with no relief.

## 2015-08-06 NOTE — Discharge Instructions (Signed)
Adhesive Capsulitis Adhesive capsulitis is inflammation of the tendons and ligaments that surround the shoulder joint (shoulder capsule). This condition causes the shoulder to become stiff and painful to move. Adhesive capsulitis is also called frozen shoulder. CAUSES This condition may be caused by:  An injury to the shoulder joint.  Straining the shoulder.  Not moving the shoulder for a period of time. This can happen if your arm was injured or in a sling.  Long-standing health problems, such as:  Diabetes.  Thyroid problems.  Heart disease.  Stroke.  Rheumatoid arthritis.  Lung disease. In some cases, the cause may not be known. RISK FACTORS This condition is more likely to develop in:  Women.  People who are older than 51 years of age. SYMPTOMS Symptoms of this condition include:  Pain in the shoulder when moving the arm. There may also be pain when parts of the shoulder are touched. The pain is worse at night or when at rest.  Soreness or aching in the shoulder.  Inability to move the shoulder normally.  Muscle spasms. DIAGNOSIS This condition is diagnosed with a physical exam and imaging tests, such as an X-ray or MRI. TREATMENT This condition may be treated with:  Treatment of the underlying cause or condition.  Physical therapy. This involves performing exercises to get the shoulder moving again.  Medicine. Medicine may be given to relieve pain, inflammation, or muscle spasms.  Steroid injections into the shoulder joint.  Shoulder manipulation. This is a procedure to move the shoulder into another position. It is done after you are given a medicine to make you fall asleep (general anesthetic). The joint may also be injected with salt water at high pressure to break down scarring.  Surgery. This may be done in severe cases when other treatments have failed. Although most people recover completely from adhesive capsulitis, some may not regain the full  movement of the shoulder. HOME CARE INSTRUCTIONS  Take over-the-counter and prescription medicines only as told by your health care provider.  If you are being treated with physical therapy, follow instructions from your physical therapist.  Avoid exercises that put a lot of demand on your shoulder, such as throwing. These exercises can make pain worse.  If directed, apply ice to the injured area:  Put ice in a plastic bag.  Place a towel between your skin and the bag.  Leave the ice on for 20 minutes, 2-3 times per day. SEEK MEDICAL CARE IF:  You develop new symptoms.  Your symptoms get worse.   This information is not intended to replace advice given to you by your health care provider. Make sure you discuss any questions you have with your health care provider.   Document Released: 03/02/2009 Document Revised: 01/24/2015 Document Reviewed: 08/28/2014 Elsevier Interactive Patient Education 2016 [employer]  Joint Pain Joint pain, which is also called arthralgia, can be caused by many things. Joint pain often goes away when you follow your health care provider's instructions for relieving pain at home. However, joint pain can also be caused by conditions that require further treatment. Common causes of joint pain include:  Bruising in the area of the joint.  Overuse of the joint.  Wear and tear on the joints that occur with aging (osteoarthritis).  Various other forms of arthritis.  A buildup of a crystal form of uric acid in the joint (gout).  Infections of the joint (septic arthritis) or of the bone (osteomyelitis). Your health care provider may recommend medicine  to help with the pain. If your joint pain continues, additional tests may be needed to diagnose your condition. HOME CARE INSTRUCTIONS Watch your condition for any changes. Follow these instructions as directed to lessen the pain that you are feeling.  Take medicines only as directed by your health care  provider.  Rest the affected area for as long as your health care provider says that you should. If directed to do so, raise the painful joint above the level of your heart while you are sitting or lying down.  Do not do things that cause or worsen pain.  If directed, apply ice to the painful area:  Put ice in a plastic bag.  Place a towel between your skin and the bag.  Leave the ice on for 20 minutes, 2-3 times per day.  Wear an elastic bandage, splint, or sling as directed by your health care provider. Loosen the elastic bandage or splint if your fingers or toes become numb and tingle, or if they turn cold and blue.  Begin exercising or stretching the affected area as directed by your health care provider. Ask your health care provider what types of exercise are safe for you.  Keep all follow-up visits as directed by your health care provider. This is important. SEEK MEDICAL CARE IF:  Your pain increases, and medicine does not help.  Your joint pain does not improve within 3 days.  You have increased bruising or swelling.  You have a fever.  You lose 10 lb (4.5 kg) or more without trying. SEEK IMMEDIATE MEDICAL CARE IF:  You are not able to move the joint.  Your fingers or toes become numb or they turn cold and blue.   This information is not intended to replace advice given to you by your health care provider. Make sure you discuss any questions you have with your health care provider.   Document Released: 05/05/2005 Document Revised: 05/26/2014 Document Reviewed: 02/14/2014 Elsevier Interactive Patient Education 2016 [employer] Asthma Attack Prevention While you may not be able to control the fact that you have asthma, you can take actions to prevent asthma attacks. The best way to prevent asthma attacks is to maintain good control of your asthma. You can achieve this by:  Taking your medicines as directed.  Avoiding things that can irritate your airways or make  your asthma symptoms worse (asthma triggers).  Keeping track of how well your asthma is controlled and of any changes in your symptoms.  Responding quickly to worsening asthma symptoms (asthma attack).  Seeking emergency care when it is needed. WHAT ARE SOME WAYS TO PREVENT AN ASTHMA ATTACK? Have a Plan Work with your health care provider to create a written plan for managing and treating your asthma attacks (asthma action plan). This plan includes:  A list of your asthma triggers and how you can avoid them.  Information on when medicines should be taken and when their dosages should be changed.  The use of a device that measures how well your lungs are working (peak flow meter). Monitor Your Asthma Use your peak flow meter and record your results in a journal every day. A drop in your peak flow numbers on one or more days may indicate the start of an asthma attack. This can happen even before you start to feel symptoms. You can prevent an asthma attack from getting worse by following the steps in your asthma action plan. Avoid Asthma Triggers Work with your asthma health care provider  to find out what your asthma triggers are. This can be done by:  Allergy testing.  Keeping a journal that notes when asthma attacks occur and the factors that may have contributed to them.  Determining if there are other medical conditions that are making your asthma worse. Once you have determined your asthma triggers, take steps to avoid them. This may include avoiding excessive or prolonged exposure to:  Dust. Have someone dust and vacuum your home for you once or twice a week. Using a high-efficiency particulate arrestance (HEPA) vacuum is best.  Smoke. This includes campfire smoke, forest fire smoke, and secondhand smoke from tobacco products.  Pet dander. Avoid contact with animals that you know you are allergic to.  Allergens from trees, grasses or pollens. Avoid spending a lot of time outdoors  when pollen counts are high, and on very windy days.  Very cold, dry, or humid air.  Mold.  Foods that contain high amounts of sulfites.  Strong odors.  Outdoor air pollutants, such as Museum/gallery exhibitions officer.  Indoor air pollutants, such as aerosol sprays and fumes from household cleaners.  Household pests, including dust mites and cockroaches, and pest droppings.  Certain medicines, including NSAIDs. Always talk to your health care provider before stopping or starting any new medicines. Medicines Take over-the-counter and prescription medicines only as told by your health care provider. Many asthma attacks can be prevented by carefully following your medicine schedule. Taking your medicines correctly is especially important when you cannot avoid certain asthma triggers. Act Quickly If an asthma attack does happen, acting quickly can decrease how severe it is and how long it lasts. Take these steps:   Pay attention to your symptoms. If you are coughing, wheezing, or having difficulty breathing, do not wait to see if your symptoms go away on their own. Follow your asthma action plan.  If you have followed your asthma action plan and your symptoms are not improving, call your health care provider or seek immediate medical care at the nearest hospital. It is important to note how often you need to use your fast-acting rescue inhaler. If you are using your rescue inhaler more often, it may mean that your asthma is not under control. Adjusting your asthma treatment plan may help you to prevent future asthma attacks and help you to gain better control of your condition. HOW CAN I PREVENT AN ASTHMA ATTACK WHEN I EXERCISE? Follow advice from your health care provider about whether you should use your fast-acting inhaler before exercising. Many people with asthma experience exercise-induced bronchoconstriction (EIB). This condition often worsens during vigorous exercise in cold, humid, or dry environments.  Usually, people with EIB can stay very active by pre-treating with a fast-acting inhaler before exercising.   This information is not intended to replace advice given to you by your health care provider. Make sure you discuss any questions you have with your health care provider.   Document Released: 04/23/2009 Document Revised: 01/24/2015 Document Reviewed: 10/05/2014 Elsevier Interactive Patient Education [employer]

## 2015-08-06 NOTE — ED Provider Notes (Signed)
CSN: [id number]     Arrival date & time 08/06/15  1325 History  By signing my name below, I, Kendra Cox, attest that this documentation has been prepared under the direction and in the presence of Ok EdwardsLeslie Karen [name], PA-C. Electronically Signed: Lyndel SafeKaitlyn Cox, ED Scribe. 08/06/2015. 3:00 PM.  Chief Complaint  Patient presents with  . Shoulder Pain   Patient is a 50 y.o. female presenting with shoulder pain. The history is provided by the patient. No language interpreter was used.  Shoulder Pain Location:  Shoulder Time since incident:  2 months Injury: yes   Mechanism of injury: fall   Fall:    Fall occurred:  From a vehicle Shoulder location:  R shoulder Pain details:    Radiates to:  R arm   Severity:  Moderate   Duration:  2 months   Timing:  Constant   Progression:  Unchanged Dislocation: no   Prior injury to area:  No Relieved by:  Nothing Worsened by:  Movement Ineffective treatments:  NSAIDs and acetaminophen Associated symptoms: decreased range of motion    HPI Comments: Kendra Cox is a 50 y.o. female who presents to the Emergency Department complaining of constant, moderate right shoulder pain that radiates down right arm s/p fall that occurred 2 months ago. Pt reports she fell off a truck landing onto her right shoulder 2 months ago. Denies head injury or LOC sustained during fall. She associates decreased ROM of right shoulder. Pt has taken tylenol and OTC pain medications without significant relief. No current PCP.   Pt also c/o some mild, intermittent wheezing that is unrelieved by home use of Pro-Air. No SOB, cough, or chest pain.    Past Medical History  Diagnosis Date  . Hypertension   . Depression    Past Surgical History  Procedure Laterality Date  . Abdominal hysterectomy    . Thumb surgery    . Breast surgery     No family history on file. Social History  Substance Use Topics  . Smoking status: Current Every Day Smoker -- 1.00 packs/day     Types: Cigarettes  . Smokeless tobacco: None  . Alcohol Use: No   OB History    No data available     Review of Systems  Respiratory: Positive for wheezing. Negative for cough and shortness of breath.   Cardiovascular: Negative for chest pain.  Musculoskeletal: Positive for arthralgias ( right shoulder).  Neurological: Negative for syncope.  All other systems reviewed and are negative.  Allergies  Sulfa antibiotics  Home Medications   Prior to Admission medications   Medication Sig Start Date End Date Taking? Authorizing Provider  ALPRAZolam Prudy Feeler(XANAX) 1 MG tablet Take 1 mg by mouth daily as needed for sleep or anxiety.    Historical Provider, MD  BIOTIN PO Take 1 capsule by mouth at bedtime.    Historical Provider, MD  ciprofloxacin (CIPRO) 500 MG tablet Take 1 tablet (500 mg total) by mouth every 12 (twelve) hours. 06/07/15   Shon Batonourtney F Horton, MD  clindamycin (CLEOCIN) 150 MG capsule Take 2 capsules (300 mg total) by mouth 4 (four) times daily. For 7 days 03/26/15   Tammy Triplett, PA-C  dexamethasone (DECADRON) 4 MG tablet Take 1 tablet (4 mg total) by mouth 2 (two) times daily with a meal. 06/26/15   Ivery QualeHobson Bryant, PA-C  doxycycline (VIBRAMYCIN) 100 MG capsule Take 1 capsule (100 mg total) by mouth 2 (two) times daily. 06/26/15   Ivery QualeHobson Bryant, PA-C  HYDROcodone-acetaminophen (  NORCO/VICODIN) 5-325 MG tablet Take 1 tablet by mouth every 6 (six) hours as needed for moderate pain.    Historical Provider, MD  HYDROcodone-homatropine (HYCODAN) 5-1.5 MG/5ML syrup Take 5 mLs by mouth every 6 (six) hours as needed. 06/26/15   Ivery Quale, PA-C  lisinopril-hydrochlorothiazide (PRINZIDE) 10-12.5 MG per tablet Take 1 tablet by mouth daily. 01/17/13   Zadie Rhine, MD  methocarbamol (ROBAXIN) 500 MG tablet Take 1 tablet (500 mg total) by mouth 3 (three) times daily. 03/26/15   Tammy Triplett, PA-C  ondansetron (ZOFRAN ODT) 4 MG disintegrating tablet Take 1 tablet (4 mg total) by mouth every 8  (eight) hours as needed for nausea or vomiting. 06/07/15   Shon Baton, MD  oxyCODONE-acetaminophen (PERCOCET/ROXICET) 5-325 MG tablet Take 1-2 tablets by mouth every 6 (six) hours as needed for severe pain. 06/07/15   Shon Baton, MD  venlafaxine XR (EFFEXOR-XR) 150 MG 24 hr capsule Take 150 mg by mouth every morning.    Historical Provider, MD   BP 147/98 mmHg  Pulse 88  Temp(Src) 98.7 F (37.1 C) (Oral)  Resp 16  Wt 125 lb (56.7 kg)  SpO2 98% Physical Exam  Constitutional: She is oriented to person, place, and time. She appears well-developed and well-nourished. No distress.  HENT:  Head: Normocephalic.  Eyes: Conjunctivae are normal.  Neck: Normal range of motion. Neck supple.  Cardiovascular: Normal rate.   Pulmonary/Chest: Effort normal. No respiratory distress.  Musculoskeletal: Normal range of motion.  Neurological: She is alert and oriented to person, place, and time. Coordination normal.  Skin: Skin is warm.  Psychiatric: She has a normal mood and affect. Her behavior is normal.  Nursing note and vitals reviewed.   ED Course  Procedures  DIAGNOSTIC STUDIES: Oxygen Saturation is 98% on RA, normal by my interpretation.    COORDINATION OF CARE: 2:58 PM Discussed treatment plan with pt at bedside which includes to order arm sling and give referral to ortho Dr. Romeo Apple. Will prescribe prednisone, pain medication, and albuterol. Pt agreeable to plan.    Imaging Review Dg Shoulder Right  08/06/2015  CLINICAL DATA:  Right-sided shoulder pain radiating into the right arm, history of remote trauma a few months ago, initial encounter EXAM: RIGHT SHOULDER - 2+ VIEW COMPARISON:  None. FINDINGS: There is no evidence of fracture or dislocation. There is no evidence of arthropathy or other focal bone abnormality. Soft tissues are unremarkable. IMPRESSION: No acute abnormality noted. Electronically Signed   By: Alcide Clever M.D.   On: 08/06/2015 14:20   I have personally  reviewed and evaluated these images results as part of my medical decision-making.  MDM    Final diagnoses:  Shoulder pain, right    Meds ordered this encounter  Medications  . predniSONE (DELTASONE) 10 MG tablet    Sig: 6,5,4,3,2,1 taper    Dispense:  15 tablet    Refill:  0    Order Specific Question:  Supervising Provider    Answer:  MILLER, BRIAN [[id number]]  . albuterol (PROVENTIL HFA;VENTOLIN HFA) 108 (90 Base) MCG/ACT inhaler    Sig: Inhale 1-2 puffs into the lungs every 6 (six) hours as needed for wheezing or shortness of breath.    Dispense:  1 Inhaler    Refill:  0    Order Specific Question:  Supervising Provider    Answer:  MILLER, BRIAN [[id number]]  . HYDROcodone-acetaminophen (NORCO/VICODIN) 5-325 MG tablet    Sig: Take 2 tablets by mouth every 4 (four) hours as  needed.    Dispense:  10 tablet    Refill:  0    Order Specific Question:  Supervising Provider    Answer:  Eber Hong [[id number]]   Pt referred to Dr. Romeo Apple for evaluation.   Pt given rx for prednisone and hydrocodone.   Prednisone may also help shoulder pain. An After Visit Summary was printed and given to the patient.  I personally performed the services in this documentation, which was scribed in my presence.  The recorded information has been reviewed and considered.   Barnet Pall.  Lonia Skinner Preston, PA-C 08/06/15 1614  Eber Hong, MD 08/08/15 1438

## 2015-12-25 ENCOUNTER — Emergency Department (HOSPITAL_COMMUNITY): Payer: Self-pay

## 2015-12-25 ENCOUNTER — Encounter (HOSPITAL_COMMUNITY): Payer: Self-pay | Admitting: Emergency Medicine

## 2015-12-25 ENCOUNTER — Inpatient Hospital Stay (HOSPITAL_COMMUNITY): Payer: Self-pay

## 2015-12-25 ENCOUNTER — Inpatient Hospital Stay (HOSPITAL_COMMUNITY)
Admission: EM | Admit: 2015-12-25 | Discharge: 2015-12-27 | DRG: 440 | Disposition: A | Discharge status: Home / Self Care | Payer: Self-pay | Attending: Internal Medicine | Admitting: Internal Medicine

## 2015-12-25 DIAGNOSIS — F1010 Alcohol abuse, uncomplicated: Secondary | ICD-10-CM | POA: Diagnosis present

## 2015-12-25 DIAGNOSIS — J449 Chronic obstructive pulmonary disease, unspecified: Secondary | ICD-10-CM | POA: Diagnosis present

## 2015-12-25 DIAGNOSIS — J45909 Unspecified asthma, uncomplicated: Secondary | ICD-10-CM | POA: Diagnosis present

## 2015-12-25 DIAGNOSIS — I10 Essential (primary) hypertension: Secondary | ICD-10-CM | POA: Diagnosis present

## 2015-12-25 DIAGNOSIS — F17210 Nicotine dependence, cigarettes, uncomplicated: Secondary | ICD-10-CM | POA: Diagnosis present

## 2015-12-25 DIAGNOSIS — F329 Major depressive disorder, single episode, unspecified: Secondary | ICD-10-CM | POA: Diagnosis present

## 2015-12-25 DIAGNOSIS — Z8249 Family history of ischemic heart disease and other diseases of the circulatory system: Secondary | ICD-10-CM

## 2015-12-25 DIAGNOSIS — J441 Chronic obstructive pulmonary disease with (acute) exacerbation: Secondary | ICD-10-CM

## 2015-12-25 DIAGNOSIS — R0602 Shortness of breath: Secondary | ICD-10-CM | POA: Diagnosis present

## 2015-12-25 DIAGNOSIS — R079 Chest pain, unspecified: Secondary | ICD-10-CM | POA: Diagnosis present

## 2015-12-25 DIAGNOSIS — K859 Acute pancreatitis without necrosis or infection, unspecified: Principal | ICD-10-CM

## 2015-12-25 HISTORY — DX: Unspecified asthma, uncomplicated: J45.909

## 2015-12-25 LAB — MAGNESIUM: Magnesium: 1.5 mg/dL — ABNORMAL LOW (ref 1.7–2.4)

## 2015-12-25 LAB — CBC WITH DIFFERENTIAL/PLATELET
BASOS PCT: 0 %
Basophils Absolute: 0 10*3/uL (ref 0.0–0.1)
EOS ABS: 0.2 10*3/uL (ref 0.0–0.7)
EOS PCT: 2 %
HCT: 41.7 % (ref 36.0–46.0)
Hemoglobin: 13.9 g/dL (ref 12.0–15.0)
LYMPHS PCT: 44 %
Lymphs Abs: 4.3 10*3/uL — ABNORMAL HIGH (ref 0.7–4.0)
MCH: 31.2 pg (ref 26.0–34.0)
MCHC: 33.3 g/dL (ref 30.0–36.0)
MCV: 93.7 fL (ref 78.0–100.0)
Monocytes Absolute: 0.6 10*3/uL (ref 0.1–1.0)
Monocytes Relative: 6 %
Neutro Abs: 4.7 10*3/uL (ref 1.7–7.7)
Neutrophils Relative %: 48 %
PLATELETS: 283 10*3/uL (ref 150–400)
RBC: 4.45 MIL/uL (ref 3.87–5.11)
RDW: 14.9 % (ref 11.5–15.5)
WBC: 9.7 10*3/uL (ref 4.0–10.5)

## 2015-12-25 LAB — BASIC METABOLIC PANEL
Anion gap: 10 (ref 5–15)
BUN: 13 mg/dL (ref 6–20)
CALCIUM: 8.6 mg/dL — AB (ref 8.9–10.3)
CO2: 28 mmol/L (ref 22–32)
Chloride: 100 mmol/L — ABNORMAL LOW (ref 101–111)
Creatinine, Ser: 0.78 mg/dL (ref 0.44–1.00)
GFR calc Af Amer: >60 mL/min (ref >60)
Glucose, Bld: 98 mg/dL (ref 65–99)
POTASSIUM: 3.5 mmol/L (ref 3.5–5.1)
Sodium: 138 mmol/L (ref 135–145)

## 2015-12-25 LAB — LIPASE, BLOOD: Lipase: 189 U/L — ABNORMAL HIGH (ref 11–51)

## 2015-12-25 LAB — TROPONIN I
TROPONIN I: 0.03 ng/mL — AB (ref <0.03)
Troponin I: 0.03 ng/mL (ref <0.03)

## 2015-12-25 LAB — PHOSPHORUS: Phosphorus: 3.3 mg/dL (ref 2.5–4.6)

## 2015-12-25 MED ORDER — OXYCODONE-ACETAMINOPHEN 5-325 MG PO TABS
1.0000 | ORAL_TABLET | Freq: Four times a day (QID) | ORAL | Status: DC | PRN
  Administered 2015-12-25 – 2015-12-27 (×6): 1 via ORAL
  Filled 2015-12-25 (×7): qty 1

## 2015-12-25 MED ORDER — NICOTINE 21 MG/24HR TD PT24
21.0000 mg | MEDICATED_PATCH | Freq: Every day | TRANSDERMAL | Status: DC
  Administered 2015-12-25 – 2015-12-27 (×3): 21 mg via TRANSDERMAL
  Filled 2015-12-25 (×3): qty 1

## 2015-12-25 MED ORDER — NICOTINE 14 MG/24HR TD PT24: 14.0000 mg | MEDICATED_PATCH | Freq: Every day | TRANSDERMAL | Status: DC

## 2015-12-25 MED ORDER — PNEUMOCOCCAL VAC POLYVALENT 25 MCG/0.5ML IJ INJ
0.5000 mL | INJECTION | INTRAMUSCULAR | Status: DC
  Filled 2015-12-25: qty 0.5

## 2015-12-25 MED ORDER — ALPRAZOLAM 1 MG PO TABS
1.0000 mg | ORAL_TABLET | Freq: Two times a day (BID) | ORAL | Status: DC | PRN
Start: 2015-12-25 — End: 2015-12-27
  Administered 2015-12-25 – 2015-12-27 (×4): 1 mg via ORAL
  Filled 2015-12-25 (×4): qty 1

## 2015-12-25 MED ORDER — BACLOFEN 10 MG PO TABS
10.0000 mg | ORAL_TABLET | Freq: Two times a day (BID) | ORAL | Status: DC
  Administered 2015-12-25 – 2015-12-27 (×4): 10 mg via ORAL
  Filled 2015-12-25 (×4): qty 1

## 2015-12-25 MED ORDER — IPRATROPIUM-ALBUTEROL 0.5-2.5 (3) MG/3ML IN SOLN
3.0000 mL | Freq: Four times a day (QID) | RESPIRATORY_TRACT | Status: DC
  Administered 2015-12-25 – 2015-12-26 (×5): 3 mL via RESPIRATORY_TRACT
  Filled 2015-12-25 (×5): qty 3

## 2015-12-25 MED ORDER — LISINOPRIL 10 MG PO TABS
20.0000 mg | ORAL_TABLET | Freq: Every day | ORAL | Status: DC
  Administered 2015-12-25 – 2015-12-27 (×3): 20 mg via ORAL
  Filled 2015-12-25 (×3): qty 2

## 2015-12-25 MED ORDER — MONTELUKAST SODIUM 10 MG PO TABS
10.0000 mg | ORAL_TABLET | Freq: Every day | ORAL | Status: DC
  Administered 2015-12-25 – 2015-12-26 (×2): 10 mg via ORAL
  Filled 2015-12-25 (×2): qty 1

## 2015-12-25 MED ORDER — VENLAFAXINE HCL ER 75 MG PO CP24: 150.0000 mg | ORAL_CAPSULE | Freq: Every morning | ORAL | Status: DC

## 2015-12-25 MED ORDER — METHYLPREDNISOLONE SODIUM SUCC 125 MG IJ SOLR
60.0000 mg | Freq: Two times a day (BID) | INTRAMUSCULAR | Status: DC
  Administered 2015-12-25 – 2015-12-27 (×4): 60 mg via INTRAVENOUS
  Filled 2015-12-25 (×4): qty 2

## 2015-12-25 MED ORDER — ADULT MULTIVITAMIN W/MINERALS CH
1.0000 | ORAL_TABLET | Freq: Every day | ORAL | Status: DC
  Administered 2015-12-25 – 2015-12-27 (×3): 1 via ORAL
  Filled 2015-12-25 (×3): qty 1

## 2015-12-25 MED ORDER — METHYLPREDNISOLONE SODIUM SUCC 125 MG IJ SOLR
125.0000 mg | Freq: Once | INTRAMUSCULAR | Status: AC
  Administered 2015-12-25: 125 mg via INTRAVENOUS
  Filled 2015-12-25: qty 2

## 2015-12-25 MED ORDER — VENLAFAXINE HCL ER 75 MG PO CP24
150.0000 mg | ORAL_CAPSULE | Freq: Every morning | ORAL | Status: DC
  Administered 2015-12-25 – 2015-12-27 (×3): 150 mg via ORAL
  Filled 2015-12-25 (×3): qty 2

## 2015-12-25 MED ORDER — LORAZEPAM 2 MG/ML IJ SOLN
1.0000 mg | Freq: Four times a day (QID) | INTRAMUSCULAR | Status: DC | PRN
  Administered 2015-12-27: 1 mg via INTRAVENOUS
  Filled 2015-12-25: qty 1

## 2015-12-25 MED ORDER — LORAZEPAM 1 MG PO TABS
1.0000 mg | ORAL_TABLET | Freq: Four times a day (QID) | ORAL | Status: DC | PRN
  Administered 2015-12-26: 1 mg via ORAL
  Filled 2015-12-25 (×2): qty 1

## 2015-12-25 MED ORDER — VITAMIN B-1 100 MG PO TABS
100.0000 mg | ORAL_TABLET | Freq: Every day | ORAL | Status: DC
  Administered 2015-12-25 – 2015-12-27 (×3): 100 mg via ORAL
  Filled 2015-12-25 (×3): qty 1

## 2015-12-25 MED ORDER — AMLODIPINE BESYLATE 5 MG PO TABS
5.0000 mg | ORAL_TABLET | Freq: Every day | ORAL | Status: DC
  Administered 2015-12-25 – 2015-12-27 (×3): 5 mg via ORAL
  Filled 2015-12-25 (×3): qty 1

## 2015-12-25 MED ORDER — THIAMINE HCL 100 MG/ML IJ SOLN
100.0000 mg | Freq: Every day | INTRAMUSCULAR | Status: DC
  Filled 2015-12-25 (×3): qty 2

## 2015-12-25 MED ORDER — IPRATROPIUM-ALBUTEROL 0.5-2.5 (3) MG/3ML IN SOLN
3.0000 mL | Freq: Once | RESPIRATORY_TRACT | Status: AC
  Administered 2015-12-25: 3 mL via RESPIRATORY_TRACT
  Filled 2015-12-25: qty 3

## 2015-12-25 MED ORDER — FOLIC ACID 1 MG PO TABS
1.0000 mg | ORAL_TABLET | Freq: Every day | ORAL | Status: DC
  Administered 2015-12-25 – 2015-12-27 (×3): 1 mg via ORAL
  Filled 2015-12-25 (×3): qty 1

## 2015-12-25 MED ORDER — IOPAMIDOL (ISOVUE-370) INJECTION 76%
100.0000 mL | Freq: Once | INTRAVENOUS | Status: AC | PRN
  Administered 2015-12-25: 100 mL via INTRAVENOUS

## 2015-12-25 MED ORDER — ENOXAPARIN SODIUM 40 MG/0.4ML ~~LOC~~ SOLN
40.0000 mg | SUBCUTANEOUS | Status: DC
  Administered 2015-12-25 – 2015-12-26 (×2): 40 mg via SUBCUTANEOUS
  Filled 2015-12-25 (×2): qty 0.4

## 2015-12-25 MED ORDER — ASPIRIN EC 81 MG PO TBEC
81.0000 mg | DELAYED_RELEASE_TABLET | Freq: Every day | ORAL | Status: DC
Start: 2015-12-25 — End: 2015-12-27
  Administered 2015-12-25 – 2015-12-27 (×3): 81 mg via ORAL
  Filled 2015-12-25 (×3): qty 1

## 2015-12-25 MED ORDER — IPRATROPIUM-ALBUTEROL 0.5-2.5 (3) MG/3ML IN SOLN: 3.0000 mL | Freq: Four times a day (QID) | RESPIRATORY_TRACT | Status: DC | PRN

## 2015-12-25 MED ORDER — BUDESONIDE 0.25 MG/2ML IN SUSP
0.2500 mg | Freq: Two times a day (BID) | RESPIRATORY_TRACT | Status: DC
  Administered 2015-12-25 – 2015-12-27 (×4): 0.25 mg via RESPIRATORY_TRACT
  Filled 2015-12-25 (×4): qty 2

## 2015-12-25 NOTE — Progress Notes (Signed)
Per family report, patient has been untruthful regarding use of alcohol and prescription drug use.  Per family, patient has been drunk every day for several months and abuses prescription medications, such as "pain meds and valium.  Dr. Dartha Lodgegbata paged and made aware.

## 2015-12-25 NOTE — ED Triage Notes (Signed)
Pt c/o cp with bilateral arm pain and nausea/sob/dizziness since 1200 today.

## 2015-12-25 NOTE — ED Provider Notes (Signed)
[facility]-EMERGENCY DEPT Provider Note   CSN: [id number] Arrival date & time: 12/25/15  1334  First Provider Contact:  None       History   Chief Complaint Chief Complaint  Patient presents with  . Chest Pain    HPI Kendra Cox is a 50 y.o. female.  Patient is a 50 year old female with past medical history of asthma and I suspect COPD, depression, and hypertension. She presents for evaluation of chest discomfort. This started approximately 2 hours ago shortly after waking from sleep. Her discomfort is located to the left side of the chest and began in the absence of any injury or trauma She reports chest congestion and coughing up phlegm for several days. She has no prior cardiac history. She does have several cardiac risk factors.    Chest Pain      Past Medical History:  Diagnosis Date  . Asthma   . Depression   . Hypertension     There are no active problems to display for this patient.   Past Surgical History:  Procedure Laterality Date  . ABDOMINAL HYSTERECTOMY    . BREAST SURGERY    . thumb surgery      OB History    No data available       Home Medications    Prior to Admission medications   Medication Sig Start Date End Date Taking? Authorizing Provider  albuterol (PROVENTIL HFA;VENTOLIN HFA) 108 (90 Base) MCG/ACT inhaler Inhale 1-2 puffs into the lungs every 6 (six) hours as needed for wheezing or shortness of breath. 08/06/15   Elson Areas, PA-C  ALPRAZolam Prudy Feeler) 1 MG tablet Take 1 mg by mouth daily as needed for sleep or anxiety.    Historical Provider, MD  BIOTIN PO Take 1 capsule by mouth at bedtime.    Historical Provider, MD  ciprofloxacin (CIPRO) 500 MG tablet Take 1 tablet (500 mg total) by mouth every 12 (twelve) hours. 06/07/15   Shon Baton, MD  clindamycin (CLEOCIN) 150 MG capsule Take 2 capsules (300 mg total) by mouth 4 (four) times daily. For 7 days 03/26/15   Tammy Triplett, PA-C  dexamethasone (DECADRON) 4 MG tablet Take 1  tablet (4 mg total) by mouth 2 (two) times daily with a meal. 06/26/15   Ivery Quale, PA-C  doxycycline (VIBRAMYCIN) 100 MG capsule Take 1 capsule (100 mg total) by mouth 2 (two) times daily. 06/26/15   Ivery Quale, PA-C  HYDROcodone-acetaminophen (NORCO/VICODIN) 5-325 MG tablet Take 2 tablets by mouth every 4 (four) hours as needed. 08/06/15   Elson Areas, PA-C  lisinopril-hydrochlorothiazide (PRINZIDE) 10-12.5 MG per tablet Take 1 tablet by mouth daily. 01/17/13   Zadie Rhine, MD  methocarbamol (ROBAXIN) 500 MG tablet Take 1 tablet (500 mg total) by mouth 3 (three) times daily. 03/26/15   Tammy Triplett, PA-C  ondansetron (ZOFRAN ODT) 4 MG disintegrating tablet Take 1 tablet (4 mg total) by mouth every 8 (eight) hours as needed for nausea or vomiting. 06/07/15   Shon Baton, MD  predniSONE (DELTASONE) 10 MG tablet 6,5,4,3,2,1 taper 08/06/15   Elson Areas, PA-C  venlafaxine XR (EFFEXOR-XR) 150 MG 24 hr capsule Take 150 mg by mouth every morning.    Historical Provider, MD    Family History No family history on file.  Social History Social History  Substance Use Topics  . Smoking status: Current Every Day Smoker    Packs/day: 1.00    Types: Cigarettes  . Smokeless tobacco: Never Used  .  Alcohol use No     Allergies   Sulfa antibiotics   Review of Systems Review of Systems  Cardiovascular: Positive for chest pain.  All other systems reviewed and are negative.    Physical Exam Updated Vital Signs BP 142/96   Pulse 96   Temp 98.2 F (36.8 C)   Resp 18   Ht 5\' 2"  (1.575 m)   Wt 135 lb (61.2 kg)   SpO2 97%   BMI 24.69 kg/m   Physical Exam  Constitutional: She is oriented to person, place, and time.  Patient is a 50 year old female who appears anxious and older than stated age.  HENT:  Head: Normocephalic and atraumatic.  Neck: Normal range of motion. Neck supple.  Cardiovascular: Normal rate and regular rhythm.  Exam reveals no gallop and no friction rub.     No murmur heard. Pulmonary/Chest: Effort normal. No respiratory distress. She has wheezes. She has no rales.  Breath sounds are rhonchorous bilaterally.  Abdominal: Soft. Bowel sounds are normal. She exhibits no distension. There is no tenderness.  Musculoskeletal: Normal range of motion.  Neurological: She is alert and oriented to person, place, and time.  Skin: Skin is warm and dry.  Nursing note and vitals reviewed.    ED Treatments / Results  Labs (all labs ordered are listed, but only abnormal results are displayed) Labs Reviewed  BASIC METABOLIC PANEL  CBC  CBC WITH DIFFERENTIAL/PLATELET  TROPONIN I  I-STAT TROPOININ, ED    EKG  EKG Interpretation None       Radiology No results found.  Procedures Procedures (including critical care time)  Medications Ordered in ED Medications  methylPREDNISolone sodium succinate (SOLU-MEDROL) 125 mg/2 mL injection 125 mg (not administered)  ipratropium-albuterol (DUONEB) 0.5-2.5 (3) MG/3ML nebulizer solution 3 mL (not administered)     Initial Impression / Assessment and Plan / ED Course  I have reviewed the triage vital signs and the nursing notes.  Pertinent labs & imaging results that were available during my care of the patient were reviewed by me and considered in my medical decision making (see chart for details).  Clinical Course      Final Clinical Impressions(s) / ED Diagnoses   Final diagnoses:  None   Patient presents here with left-sided chest pain and difficulty breathing. She is wheezing throughout and I suspect has underlying COPD. She does have a history of asthma and cigarette use.  Today's workup reveals no obvious abnormality on her chest x-ray. She has an unchanged EKG and troponin which is 0.03, right on the cutoff of normal. She is not feeling much better after steroids and a breathing treatment and I believe will require admission.  I've spoken with Dr. Dartha Lodgegbata who agrees to admit. He is  requesting a CT angio of the chest to rule out the possibility of pulmonary embolism. This will be obtained and the patient will be admitted to the hospitalist service.  New Prescriptions New Prescriptions   No medications on file     Geoffery Lyonsouglas Maclain Cohron, MD 12/25/15 [id number]

## 2015-12-25 NOTE — ED Notes (Signed)
RT paged for neb tx.

## 2015-12-25 NOTE — Progress Notes (Signed)
CRITICAL VALUE ALERT  Critical value received:  Troponin 0.03  Date of notification:  12/25/15  Time of notification:  1812  Critical value read back:Yes.    Nurse who received alert:  Dagoberto LigasJessica Maral Lampe, RN  MD notified (1st page):  Dr. Dartha Lodgegbata  Time of first page:  1813  MD notified (2nd page):  Time of second page:  Responding MD:  Dr. Dartha Lodgegbata  Time MD responded:

## 2015-12-25 NOTE — H&P (Signed)
History and Physical  NYESHIA MYSLIWIEC ZOX:[id number] DOB: [birth year] DOA: 12/25/2015  Referring physician: ER Physician PCP: Pearson Grippe, MD  Outpatient Specialists:    Patient coming from: Home  Chief Complaint: Chest pain  HPI: 50 year old Caucasian female with family history significant for fatal premature MI (Father died in his 22's from MI), presents with left sided chest pain, pressure like that radiated to LUE. Chest pain was said to be rated about 10/10 earlier in the morning. First troponin is 0.03. EKG reveals poor R wave progression. Patient has SOB and wheezing. Patient has over 30 park years. History of asthma and borderline COPD was reported by the patient. No headache, no neck pain, no GI symptoms or urinary symptoms. CTA chest was negative for PE. Lipase is elevated. Collateral information reveals that patient may be drinking significant amount of alchol.  ED Course: Initial Chest pain and COPD work up Pertinent labs: Troponin is 0.03. Lipase is 189 EKG: Independently reviewed.  Review of Systems:  As in HPI. Negative for fever, visual changes, sore throat, rash, new muscle aches, chest pain, SOB, dysuria, bleeding, n/v/abdominal pain.  Past Medical History:  Diagnosis Date  . Asthma   . Depression   . Hypertension     Past Surgical History:  Procedure Laterality Date  . ABDOMINAL HYSTERECTOMY    . BREAST SURGERY    . thumb surgery       reports that she has been smoking Cigarettes.  She has been smoking about 1.00 pack per day. She has never used smokeless tobacco. She reports that she does not drink alcohol or use drugs.  Allergies  Allergen Reactions  . Sulfa Antibiotics Anaphylaxis    No family history on file.   Prior to Admission medications   Medication Sig Start Date End Date Taking? Authorizing Provider  albuterol (PROVENTIL HFA;VENTOLIN HFA) 108 (90 Base) MCG/ACT inhaler Inhale 1-2 puffs into the lungs every 6 (six) hours as needed for wheezing or  shortness of breath. 08/06/15  Yes Lonia Skinner [name], PA-C  ALPRAZolam Prudy Feeler) 1 MG tablet Take 1 mg by mouth 2 (two) times daily as needed for sleep or anxiety.    Yes Historical Provider, MD  baclofen (LIORESAL) 10 MG tablet Take 10 mg by mouth 2 (two) times daily.   Yes Historical Provider, MD  lisinopril-hydrochlorothiazide (PRINZIDE) 10-12.5 MG per tablet Take 1 tablet by mouth daily. 01/17/13  Yes Zadie Rhine, MD  oxyCODONE-acetaminophen (PERCOCET/ROXICET) 5-325 MG tablet Take 1 tablet by mouth every 6 (six) hours as needed for moderate pain or severe pain.   Yes Historical Provider, MD  venlafaxine XR (EFFEXOR-XR) 150 MG 24 hr capsule Take 150 mg by mouth every morning.   Yes Historical Provider, MD    Physical Exam: Vitals:   12/25/15 1500 12/25/15 1530 12/25/15 1650 12/25/15 1700  BP: 151/94 (!) 167/108 (!) 183/133 (!) 144/94  Pulse: 87 102 (!) 111 (!) 107  Resp: 18 21 (!) 24   Temp:   98.6 F (37 C)   TempSrc:   Oral   SpO2: 99% 99% (!) 88%   Weight:      Height:       Constitutional:  . Appears calm and comfortable Eyes:  . No pallor. No jaundice.  ENMT:  . external ears, nose appear normal Neck:  . Neck is supple. No JVD Respiratory:  . CTA bilaterally, no w/r/r.  . Respiratory effort normal. No retractions or accessory muscle use Cardiovascular:  . S1S2 . No  LE extremity edema   Abdomen:  . Abdomen is soft. Vague abdominal tenderness. Organs are difficult to assess. Neurologic:  . Awake and alert. . Moves all limbs.  Wt Readings from Last 3 Encounters:  12/25/15 61.2 kg (135 lb)  08/06/15 56.7 kg (125 lb)  06/26/15 56.7 kg (125 lb)    I have personally reviewed following labs and imaging studies  Labs on Admission:  CBC:  Recent Labs Lab 12/25/15 1404  WBC 9.7  NEUTROABS 4.7  HGB 13.9  HCT 41.7  MCV 93.7  PLT 283   Basic Metabolic Panel:  Recent Labs Lab 12/25/15 1404  NA 138  K 3.5  CL 100*  CO2 28  GLUCOSE 98  BUN 13  CREATININE  0.78  CALCIUM 8.6*   Liver Function Tests: No results for input(s): AST, ALT, ALKPHOS, BILITOT, PROT, ALBUMIN in the last 168 hours. No results for input(s): LIPASE, AMYLASE in the last 168 hours. No results for input(s): AMMONIA in the last 168 hours. Coagulation Profile: No results for input(s): INR, PROTIME in the last 168 hours. Cardiac Enzymes:  Recent Labs Lab 12/25/15 1404  TROPONINI 0.03*   BNP (last 3 results) No results for input(s): PROBNP in the last 8760 hours. HbA1C: No results for input(s): HGBA1C in the last 72 hours. CBG: No results for input(s): GLUCAP in the last 168 hours. Lipid Profile: No results for input(s): CHOL, HDL, LDLCALC, TRIG, CHOLHDL, LDLDIRECT in the last 72 hours. Thyroid Function Tests: No results for input(s): TSH, T4TOTAL, FREET4, T3FREE, THYROIDAB in the last 72 hours. Anemia Panel: No results for input(s): VITAMINB12, FOLATE, FERRITIN, TIBC, IRON, RETICCTPCT in the last 72 hours. Urine analysis:    Component Value Date/Time   COLORURINE STRAW (A) 06/07/2015 0100   APPEARANCEUR CLEAR 06/07/2015 0100   LABSPEC <1.005 (L) 06/07/2015 0100   PHURINE 6.5 06/07/2015 0100   GLUCOSEU NEGATIVE 06/07/2015 0100   HGBUR MODERATE (A) 06/07/2015 0100   BILIRUBINUR NEGATIVE 06/07/2015 0100   KETONESUR NEGATIVE 06/07/2015 0100   PROTEINUR NEGATIVE 06/07/2015 0100   NITRITE NEGATIVE 06/07/2015 0100   LEUKOCYTESUR LARGE (A) 06/07/2015 0100   Sepsis Labs: @LABRCNTIP (procalcitonin:4,lacticidven:4) )No results found for this or any previous visit (from the past 240 hour(s)).    Radiological Exams on Admission: Ct Angio Chest Pe W Or Wo Contrast  Result Date: 12/25/2015 CLINICAL DATA:  50 year old female with chest pain, bilateral arm pain, shortness of breath and dizziness EXAM: CT ANGIOGRAPHY CHEST WITH CONTRAST TECHNIQUE: Multidetector CT imaging of the chest was performed using the standard protocol during bolus administration of intravenous  contrast. Multiplanar CT image reconstructions and MIPs were obtained to evaluate the vascular anatomy. CONTRAST:  100 mL Isovue 370 COMPARISON:  Prior chest x-ray 12/25/2015 FINDINGS: Mediastinum: Unremarkable CT appearance of the thyroid gland. No suspicious mediastinal or hilar adenopathy. No soft tissue mediastinal mass. The thoracic esophagus is unremarkable. Heart/Vascular: Adequate opacification of the pulmonary arteries to the proximal subsegmental level. No evidence of acute pulmonary embolus. Conventional 3 vessel arch anatomy. No evidence of aortic dissection or aneurysm. The heart is normal in size. No pericardial effusion. Small ductus bump the expected location. Lungs/Pleura: New small 3 mm pulmonary nodule in the anterior right upper lobe (image 42 series 7). Otherwise, the lungs are clear. Trace dependent atelectasis. Bones/Soft Tissues: No acute fracture or aggressive appearing lytic or blastic osseous lesion. Upper Abdomen: Punctate calcifications in the spleen consistent with sequelae of old granulomatous disease. There is a small amount of fluid in the  gastrosplenic ligament extending inferiorly to surround the pancreatic tail. Evaluation of this region is somewhat limited by respiratory motion. Review of the MIP images confirms the above findings. IMPRESSION: 1. Negative for acute pulmonary embolus, pneumonia or other acute cardiopulmonary process. 2. Nonspecific fluid in the region of the pancreatic tail and gastrosplenic ligament. This could represent incompletely imaged acute pancreatitis. If clinically warranted, consider CT scan of the abdomen and pelvis with contrast. 3. Small splenic calcifications likely represent old granulomatous disease. 4. Nonspecific 3 mm subpleural pulmonary nodule in the anterior right lower lobe. No follow-up needed if patient is low-risk. Non-contrast chest CT can be considered in 12 months if patient is high-risk. This recommendation follows the consensus  statement: Guidelines for Management of Incidental Pulmonary Nodules Detected on CT Images:From the Fleischner Society 2017; published online before print (10.1148/radiol.[id number]). Electronically Signed   By: Malachy Moan M.D.   On: 12/25/2015 16:10   Dg Chest Port 1 View  Result Date: 12/25/2015 CLINICAL DATA:  Shortness of breath, chest pain EXAM: PORTABLE CHEST 1 VIEW COMPARISON:  12/07/2011 FINDINGS: Cardiomediastinal silhouette is stable. No acute infiltrate or pleural effusion. No pulmonary edema. Bony thorax is unremarkable. IMPRESSION: No active disease. Electronically Signed   By: Natasha Mead M.D.   On: 12/25/2015 14:24    EKG: Independently reviewed.   Active Problems:   Chest pain   SOB (shortness of breath)   Assessment/Plan 1. Chest pain 2. SOB/Wheezing 3. Possible asthma with exacerbation 4. Possible undiagnosed COPD with exacerbation 5. Tobacco use 6. Accelerated Hypertension 7. Possible alcohol abuse 8. Elevated lipase   Admit patient to telemetry floor.  Cycle cardiac enzymes  NPO after midnight  Consider further cardiac work up and cardiology consult in am  Aspirin  IV solumedrol  Peak flow daily  Nebs duoneb  Singulair  Nebs pulmicort  Counseled to quit alcohol  DC Lisinopril/HCTZ  Start lisinopril 20mg  po once daily  Start Norvasc 5mg  po once daily  Monitor and replace electrolytes  Check lipase, and if elevated, pursue CT Abdomen.  Further management will depend on hospital course  DVT prophylaxis: [place] Lovenox Code Status: Full Family Communication:  Disposition Plan: Home eventually   Consults called: None. Low threshold to consult Cardiology and Pulmonary   Admission status: Inpatient    Time spent: Greater than 60 minutes  Berton Mount, MD  [facility] Pager #: [phone] 7PM-7AM contact night coverage as above   12/25/2015, 5:10 PM

## 2015-12-25 NOTE — ED Notes (Addendum)
CRITICAL VALUE ALERT  Critical value received:  Troponin 0.03  Date of notification: 12/25/15  Time of notification:  1500  Critical value read back: yes  Nurse who received alert:  Garald BraverJ. Delesha Pohlman rn  MD notified (1st page): delo  Time of first page:  1502  MD notified (2nd page):  Time of second page:  Responding MD:  delo  Time MD responded: 1502

## 2015-12-25 NOTE — ED Notes (Addendum)
Pt noted to have decrease in RR and O2 sats dropped to 80s on 2L. With stimulation, sats would increase back to 90s. Dr. Judd Lienelo notified and is at bedside. Increased oxygen to 4L per MD. Pt denies taking any medication today.

## 2015-12-26 ENCOUNTER — Inpatient Hospital Stay (HOSPITAL_COMMUNITY): Payer: Self-pay

## 2015-12-26 DIAGNOSIS — K859 Acute pancreatitis without necrosis or infection, unspecified: Principal | ICD-10-CM

## 2015-12-26 DIAGNOSIS — R0789 Other chest pain: Secondary | ICD-10-CM

## 2015-12-26 DIAGNOSIS — R079 Chest pain, unspecified: Secondary | ICD-10-CM

## 2015-12-26 DIAGNOSIS — R7989 Other specified abnormal findings of blood chemistry: Secondary | ICD-10-CM

## 2015-12-26 LAB — COMPREHENSIVE METABOLIC PANEL
ALT: 27 U/L (ref 14–54)
AST: 35 U/L (ref 15–41)
Albumin: 4.3 g/dL (ref 3.5–5.0)
Alkaline Phosphatase: 59 U/L (ref 38–126)
Anion gap: 11 (ref 5–15)
BUN: 9 mg/dL (ref 6–20)
CO2: 29 mmol/L (ref 22–32)
Calcium: 9.1 mg/dL (ref 8.9–10.3)
Chloride: 96 mmol/L — ABNORMAL LOW (ref 101–111)
Creatinine, Ser: 0.57 mg/dL (ref 0.44–1.00)
GFR calc Af Amer: >60 mL/min (ref >60)
GFR calc non Af Amer: >60 mL/min (ref >60)
Glucose, Bld: 128 mg/dL — ABNORMAL HIGH (ref 65–99)
Potassium: 4.1 mmol/L (ref 3.5–5.1)
Sodium: 136 mmol/L (ref 135–145)
Total Bilirubin: 0.4 mg/dL (ref 0.3–1.2)
Total Protein: 7 g/dL (ref 6.5–8.1)

## 2015-12-26 LAB — ECHOCARDIOGRAM COMPLETE
AVLVOTPG: 7 mmHg
CHL CUP MV DEC (S): 165
CHL CUP RV SYS PRESS: 23 mmHg
CHL CUP STROKE VOLUME: 39 mL
EERAT: 6.97
EWDT: 165 ms
FS: 36 % (ref 28–44)
Height: 62 in
IV/PV OW: 0.95
LA vol A4C: 29.7 ml
LA vol index: 21.4 mL/m2
LADIAMINDEX: 1.78 cm/m2
LASIZE: 30 mm
LAVOL: 36.1 mL
LEFT ATRIUM END SYS DIAM: 30 mm
LV PW d: 9.77 mm — AB (ref 0.6–1.1)
LV SIMPSON'S DISK: 67
LV dias vol: 58 mL (ref 46–106)
LV e' LATERAL: 11.2 cm/s
LV sys vol index: 11 mL/m2
LVDIAVOLIN: 34 mL/m2
LVEEAVG: 6.97
LVEEMED: 6.97
LVOT SV: 73 mL
LVOT VTI: 23.2 cm
LVOT area: 3.14 cm2
LVOT diameter: 20 mm
LVOTPV: 133 cm/s
LVSYSVOL: 19 mL (ref 14–42)
MV pk A vel: 99.4 m/s
MVPG: 2 mmHg
MVPKEVEL: 78.1 m/s
RV LATERAL S' VELOCITY: 16.5 cm/s
Reg peak vel: 224 cm/s
TAPSE: 22.6 mm
TDI e' lateral: 11.2
TDI e' medial: 9.36
TR max vel: 224 cm/s
Weight: 2257.51 oz

## 2015-12-26 LAB — CBC
HCT: 36.2 % (ref 36.0–46.0)
Hemoglobin: 12.3 g/dL (ref 12.0–15.0)
MCH: 31.3 pg (ref 26.0–34.0)
MCHC: 34 g/dL (ref 30.0–36.0)
MCV: 92.1 fL (ref 78.0–100.0)
Platelets: 291 10*3/uL (ref 150–400)
RBC: 3.93 MIL/uL (ref 3.87–5.11)
RDW: 14.6 % (ref 11.5–15.5)
WBC: 10.8 10*3/uL — ABNORMAL HIGH (ref 4.0–10.5)

## 2015-12-26 LAB — TROPONIN I
Troponin I: 0.03 ng/mL (ref <0.03)
Troponin I: 0.03 ng/mL (ref <0.03)

## 2015-12-26 MED ORDER — SODIUM CHLORIDE 0.9% FLUSH
3.0000 mL | Freq: Two times a day (BID) | INTRAVENOUS | Status: DC
  Administered 2015-12-26 – 2015-12-27 (×3): 3 mL via INTRAVENOUS

## 2015-12-26 MED ORDER — SODIUM CHLORIDE 0.9 % IV SOLN: 250.0000 mL | INTRAVENOUS | Status: DC | PRN

## 2015-12-26 MED ORDER — IOPAMIDOL (ISOVUE-300) INJECTION 61%
100.0000 mL | Freq: Once | INTRAVENOUS | Status: AC | PRN
  Administered 2015-12-26: 100 mL via INTRAVENOUS

## 2015-12-26 MED ORDER — IPRATROPIUM-ALBUTEROL 0.5-2.5 (3) MG/3ML IN SOLN
3.0000 mL | Freq: Three times a day (TID) | RESPIRATORY_TRACT | Status: DC
  Administered 2015-12-27: 3 mL via RESPIRATORY_TRACT
  Filled 2015-12-26: qty 3

## 2015-12-26 MED ORDER — SODIUM CHLORIDE 0.9% FLUSH: 3.0000 mL | INTRAVENOUS | Status: DC | PRN

## 2015-12-26 MED ORDER — DIATRIZOATE MEGLUMINE & SODIUM 66-10 % PO SOLN
ORAL | Status: AC
  Administered 2015-12-26: 30 mL
  Filled 2015-12-26: qty 30

## 2015-12-26 NOTE — Progress Notes (Signed)
*  PRELIMINARY RESULTS* Echocardiogram 2D Echocardiogram has been performed.  Stacey DrainWhite, Saim Almanza J 12/26/2015, 4:39 PM

## 2015-12-26 NOTE — Progress Notes (Signed)
**Note De-Identified  Obfuscation** Pre treatment peakflow 380 Post treatment peakflow 350

## 2015-12-26 NOTE — Consult Note (Signed)
Primary cardiologist:N/A Consulting cardiologist: Dr Dina RichJonathan Diannah Rindfleisch MD Requesting physician: Dr Dartha Lodgegbata  Clinical Summary Ms. Kendra Cox is a 50 y.o.female with strong family history of CAD (father with MI in his 51s), HTN, asthma admitted with chest pain. Pain started yesterday around noon,awoke her from sleep. 7-8/10 pressure epgiastric area with nausea and dry heaving, had some lower abdominal pain as well. +SOB. Pain constant for 24 hrs at this point, continued nausea and dry heaving. Previous mild symptoms on and off for the last month.  CT abdomen: mild to moderate pancreatitis, gallbladder distension, atherosclerosis.  CT PE negative K 3.5, Cr 0.78, Hgb 13.9, Plt 283, trop 0.03 and flat. Lipase 189.  EKG SR, no acute ischemic changes Echo pending   Allergies  Allergen Reactions  . Sulfa Antibiotics Anaphylaxis    Medications Scheduled Medications: . amLODipine  5 mg Oral Daily  . aspirin EC  81 mg Oral Daily  . baclofen  10 mg Oral BID  . budesonide (PULMICORT) nebulizer solution  0.25 mg Nebulization BID  . enoxaparin (LOVENOX) injection  40 mg Subcutaneous Q24H  . folic acid  1 mg Oral Daily  . ipratropium-albuterol  3 mL Nebulization Q6H  . lisinopril  20 mg Oral Daily  . methylPREDNISolone (SOLU-MEDROL) injection  60 mg Intravenous Q12H  . montelukast  10 mg Oral QHS  . multivitamin with minerals  1 tablet Oral Daily  . nicotine  21 mg Transdermal Daily  . pneumococcal 23 valent vaccine  0.5 mL Intramuscular Tomorrow-1000  . sodium chloride flush  3 mL Intravenous Q12H  . thiamine  100 mg Oral Daily   Or  . thiamine  100 mg Intravenous Daily  . venlafaxine XR  150 mg Oral q morning - 10a     Infusions:     PRN Medications:  sodium chloride, ALPRAZolam, ipratropium-albuterol, LORazepam **OR** LORazepam, oxyCODONE-acetaminophen, sodium chloride flush   Past Medical History:  Diagnosis Date  . Asthma   . Depression   . Hypertension     Past Surgical  History:  Procedure Laterality Date  . ABDOMINAL HYSTERECTOMY    . BREAST SURGERY    . thumb surgery      History reviewed. No pertinent family history.  Social History Ms. Kendra Cox reports that she has been smoking Cigarettes.  She has been smoking about 1.00 pack per day. She has never used smokeless tobacco. Ms. Kendra Cox reports that she drinks alcohol.  Review of Systems CONSTITUTIONAL: No weight loss, fever, chills, weakness or fatigue.  HEENT: Eyes: No visual loss, blurred vision, double vision or yellow sclerae. No hearing loss, sneezing, congestion, runny nose or sore throat.  SKIN: No rash or itching.  CARDIOVASCULAR: per HPI RESPIRATORY: No shortness of breath, cough or sputum.  GASTROINTESTINAL: per HPI GENITOURINARY: no polyuria, no dysuria NEUROLOGICAL: No headache, dizziness, syncope, paralysis, ataxia, numbness or tingling in the extremities. No change in bowel or bladder control.  MUSCULOSKELETAL: No muscle, back pain, joint pain or stiffness.  HEMATOLOGIC: No anemia, bleeding or bruising.  LYMPHATICS: No enlarged nodes. No history of splenectomy.  PSYCHIATRIC: No history of depression or anxiety.      Physical Examination Blood pressure (!) 170/102, pulse 90, temperature 98.3 F (36.8 C), temperature source Oral, resp. rate 20, height 5\' 2"  (1.575 m), weight 141 lb 1.5 oz (64 kg), SpO2 100 %.  Intake/Output Summary (Last 24 hours) at 12/26/15 1201 Last data filed at 12/26/15 0943  Gross per 24 hour  Intake  3 ml  Output                0 ml  Net                3 ml    HEENT: sclera clear, throat clear  Cardiovascular: regular, tachy, no m/r/g, no jvd  Respiratory: CTAB  GI: abdomen soft, NT, ND  MSK:no LE edema  Neuro: no focal deficits  Psych: appropriate affect   Lab Results  Basic Metabolic Panel:  Recent Labs Lab 12/25/15 1404 12/25/15 1722 12/26/15 0532  NA 138  --  136  K 3.5  --  4.1  CL 100*  --  96*  CO2 28  --  29    GLUCOSE 98  --  128*  BUN 13  --  9  CREATININE 0.78  --  0.57  CALCIUM 8.6*  --  9.1  MG  --  1.5*  --   PHOS  --  3.3  --     Liver Function Tests:  Recent Labs Lab 12/26/15 0532  AST 35  ALT 27  ALKPHOS 59  BILITOT 0.4  PROT 7.0  ALBUMIN 4.3    CBC:  Recent Labs Lab 12/25/15 1404 12/26/15 0532  WBC 9.7 10.8*  NEUTROABS 4.7  --   HGB 13.9 12.3  HCT 41.7 36.2  MCV 93.7 92.1  PLT 283 291    Cardiac Enzymes:  Recent Labs Lab 12/25/15 1404 12/25/15 1722 12/25/15 2345 12/26/15 0532  TROPONINI 0.03* 0.03* 0.03* 0.03*    BNP: Invalid input(s): POCBNP    Impression/Recommendations 1. Chest pain - noncardiac in description, symptoms and imaging are consistent with pancreatitis. Tend in epigastric area to palpation. - trop 0.03 and flat, nonspecific pattern. EKG without ischemic changes - she does have multiple CAD risk factors and evidence of atherosclerosis on imaging. Once pancreatitis resolves, if continued symptoms can consider outpatient stress testing  2. Tachycardia - sinus tachycardia, likely physiologic response to pancreatitis, no evidence of arrhythmias.    - we will f/u echo, in normal we will sign off. Please arrange outpatient f/u with cardiology 3 weeks after discharge.   Dina Rich, M.D.,

## 2015-12-26 NOTE — Progress Notes (Signed)
PROGRESS NOTE    Kendra Cox  ZOX:[id number] DOB: June 29, 1965 DOA: 12/25/2015 PCP: Pearson Grippe, MD     Brief Narrative:  50 y/o admitted on 8/8 from home with chest/epigastric pain. Found to have acute pancreatitis. There is also secondary information regarding significant alcohol use.   Assessment & Plan:   Active Problems:   Chest pain   SOB (shortness of breath)   Pancreatitis, acute   Chest pain -Minimally elevated troponin, ECHO with normal EF and no WMA. -Seen by cardiology; no further work up planned for at present.  Acute Pancreatitis -Pain is somewhat improved, only taking PO pain meds. -Will advance diet per patient request. -May have a developing pancreatic pseudocyst.  ETOH Abuse -Thiamine/folate. -Monitor for withdrawals on CIWA protocol.   DVT prophylaxis: Lovenox Code Status: Full Code Family Communication: Patient only Disposition Plan: Likely home in 24-48 hours  Consultants:   None  Procedures:   None  Antimicrobials:   None    Subjective: Still with mild abdominal pain, but much improved. Would like diet advanced, anxious to be discharged home soon.  Objective: Vitals:   12/26/15 0749 12/26/15 0756 12/26/15 1404 12/26/15 1427  BP:    (!) 170/101  Pulse:    (!) 109  Resp:    20  Temp:    98.2 F (36.8 C)  TempSrc:    Oral  SpO2: 98% 100% 99% 100%  Weight:      Height:        Intake/Output Summary (Last 24 hours) at 12/26/15 1815 Last data filed at 12/26/15 1428  Gross per 24 hour  Intake                3 ml  Output                0 ml  Net                3 ml   Filed Weights   12/25/15 1349 12/25/15 1712  Weight: 61.2 kg (135 lb) 64 kg (141 lb 1.5 oz)    Examination:  General exam: Alert, awake, oriented x 3 Respiratory system: Clear to auscultation. Respiratory effort normal. Cardiovascular system:RRR. No murmurs, rubs, gallops. Gastrointestinal system: Abdomen is nondistended, TTP of epigastric and LUQ. No  organomegaly or masses felt. Normal bowel sounds heard. Central nervous system: Alert and oriented. No focal neurological deficits. Extremities: No C/C/E, +pedal pulses Skin: No rashes, lesions or ulcers Psychiatry: Judgement and insight appear normal. Mood & affect appropriate.     Data Reviewed: I have personally reviewed following labs and imaging studies  CBC:  Recent Labs Lab 12/25/15 1404 12/26/15 0532  WBC 9.7 10.8*  NEUTROABS 4.7  --   HGB 13.9 12.3  HCT 41.7 36.2  MCV 93.7 92.1  PLT 283 291   Basic Metabolic Panel:  Recent Labs Lab 12/25/15 1404 12/25/15 1722 12/26/15 0532  NA 138  --  136  K 3.5  --  4.1  CL 100*  --  96*  CO2 28  --  29  GLUCOSE 98  --  128*  BUN 13  --  9  CREATININE 0.78  --  0.57  CALCIUM 8.6*  --  9.1  MG  --  1.5*  --   PHOS  --  3.3  --    GFR: Estimated Creatinine Clearance: 74 mL/min (by C-G formula based on SCr of 0.8 mg/dL). Liver Function Tests:  Recent Labs Lab 12/26/15 0532  AST  35  ALT 27  ALKPHOS 59  BILITOT 0.4  PROT 7.0  ALBUMIN 4.3    Recent Labs Lab 12/25/15 1722  LIPASE 189*   No results for input(s): AMMONIA in the last 168 hours. Coagulation Profile: No results for input(s): INR, PROTIME in the last 168 hours. Cardiac Enzymes:  Recent Labs Lab 12/25/15 1404 12/25/15 1722 12/25/15 2345 12/26/15 0532  TROPONINI 0.03* 0.03* 0.03* 0.03*   BNP (last 3 results) No results for input(s): PROBNP in the last 8760 hours. HbA1C: No results for input(s): HGBA1C in the last 72 hours. CBG: No results for input(s): GLUCAP in the last 168 hours. Lipid Profile: No results for input(s): CHOL, HDL, LDLCALC, TRIG, CHOLHDL, LDLDIRECT in the last 72 hours. Thyroid Function Tests: No results for input(s): TSH, T4TOTAL, FREET4, T3FREE, THYROIDAB in the last 72 hours. Anemia Panel: No results for input(s): VITAMINB12, FOLATE, FERRITIN, TIBC, IRON, RETICCTPCT in the last 72 hours. Urine analysis:      Component Value Date/Time   COLORURINE STRAW (A) 06/07/2015 0100   APPEARANCEUR CLEAR 06/07/2015 0100   LABSPEC <1.005 (L) 06/07/2015 0100   PHURINE 6.5 06/07/2015 0100   GLUCOSEU NEGATIVE 06/07/2015 0100   HGBUR MODERATE (A) 06/07/2015 0100   BILIRUBINUR NEGATIVE 06/07/2015 0100   KETONESUR NEGATIVE 06/07/2015 0100   PROTEINUR NEGATIVE 06/07/2015 0100   NITRITE NEGATIVE 06/07/2015 0100   LEUKOCYTESUR LARGE (A) 06/07/2015 0100   Sepsis Labs: (procalcitonin:4,lacticidven:4)  )No results found for this or any previous visit (from the past 240 hour(s)).       Radiology Studies: Ct Angio Chest Pe W Or Wo Contrast  Result Date: 12/25/2015 CLINICAL DATA:  50 year old female with chest pain, bilateral arm pain, shortness of breath and dizziness EXAM: CT ANGIOGRAPHY CHEST WITH CONTRAST TECHNIQUE: Multidetector CT imaging of the chest was performed using the standard protocol during bolus administration of intravenous contrast. Multiplanar CT image reconstructions and MIPs were obtained to evaluate the vascular anatomy. CONTRAST:  100 mL Isovue 370 COMPARISON:  Prior chest x-ray 12/25/2015 FINDINGS: Mediastinum: Unremarkable CT appearance of the thyroid gland. No suspicious mediastinal or hilar adenopathy. No soft tissue mediastinal mass. The thoracic esophagus is unremarkable. Heart/Vascular: Adequate opacification of the pulmonary arteries to the proximal subsegmental level. No evidence of acute pulmonary embolus. Conventional 3 vessel arch anatomy. No evidence of aortic dissection or aneurysm. The heart is normal in size. No pericardial effusion. Small ductus bump the expected location. Lungs/Pleura: New small 3 mm pulmonary nodule in the anterior right upper lobe (image 42 series 7). Otherwise, the lungs are clear. Trace dependent atelectasis. Bones/Soft Tissues: No acute fracture or aggressive appearing lytic or blastic osseous lesion. Upper Abdomen: Punctate calcifications in the  spleen consistent with sequelae of old granulomatous disease. There is a small amount of fluid in the gastrosplenic ligament extending inferiorly to surround the pancreatic tail. Evaluation of this region is somewhat limited by respiratory motion. Review of the MIP images confirms the above findings. IMPRESSION: 1. Negative for acute pulmonary embolus, pneumonia or other acute cardiopulmonary process. 2. Nonspecific fluid in the region of the pancreatic tail and gastrosplenic ligament. This could represent incompletely imaged acute pancreatitis. If clinically warranted, consider CT scan of the abdomen and pelvis with contrast. 3. Small splenic calcifications likely represent old granulomatous disease. 4. Nonspecific 3 mm subpleural pulmonary nodule in the anterior right lower lobe. No follow-up needed if patient is low-risk. Non-contrast chest CT can be considered in 12 months if patient is high-risk. This recommendation follows the consensus  statement: Guidelines for Management of Incidental Pulmonary Nodules Detected on CT Images:From the Fleischner Society 2017; published online before print (10.1148/radiol.9604540981). Electronically Signed   By: Malachy Moan M.D.   On: 12/25/2015 16:10   Ct Abdomen W Contrast  Result Date: 12/26/2015 CLINICAL DATA:  Pancreatitis.  Acute.  Nausea.  Shortness of breath. EXAM: CT ABDOMEN WITH CONTRAST TECHNIQUE: Multidetector CT imaging of the abdomen was performed using the standard protocol following bolus administration of intravenous contrast. CONTRAST:  ISOVUE-300 IOPAMIDOL (ISOVUE-300) INJECTION 61% COMPARISON:  Chest CT of 1 day prior. FINDINGS: Lower chest: Clear lung bases. Normal heart size without pericardial or pleural effusion. Hepatobiliary: Probable mild hepatic steatosis. Focal steatosis adjacent the falciform ligament. Gallbladder distension, without surrounding inflammation or biliary duct dilatation. Pancreas: Mild to moderate peripancreatic edema  again identified. The pancreas enhances homogeneously. No pancreatic duct dilatation or obstructive stone. Somewhat more focal fluid ventral and inferior to the pancreatic body/ tail junction measures on the order of 3.2 x 2.3 cm on image 30/ series 3. Spleen: Old granulomatous disease within. Adrenals/Urinary Tract: Normal left adrenal gland. Right adrenal thickening and mild nodularity, 1.4 cm image 26/series 3. 6 mm left renal lesion is fat density and represents an angiomyolipoma on image 35/ series 3. Minimal right-sided caliectasis without hydroureter. Stomach/Bowel: Normal stomach, without wall thickening. Normal imaged colon. Normal abdominal small bowel. Vascular/Lymphatic: Age advanced aortic and branch vessel atherosclerosis. Circumaortic left renal vein. Patent portal and splenic veins. No retroperitoneal or retrocrural adenopathy. Other: No ascites. Musculoskeletal: Disc bulge at the lumbosacral junction. IMPRESSION: 1. Mild to moderate  pancreatitis. 2. Somewhat more focal fluid adjacent to the pancreatic tail. Cannot exclude developing fluid collection/early pseudocyst. Depending on clinical symptomatology, imaging follow-up should be considered. 3. Gallbladder distention without biliary duct dilatation or choledocholithiasis. 4. Age advanced atherosclerosis.  Aortic atherosclerosis. 5. Probable hepatic steatosis. 6. Right adrenal nodule which is technically indeterminate but most likely an adenoma. Especially if there is any history of primary malignancy, consider dedicated nonemergent adrenal protocol CT. Electronically Signed   By: Jeronimo Greaves M.D.   On: 12/26/2015 11:51   Dg Chest Port 1 View  Result Date: 12/25/2015 CLINICAL DATA:  Shortness of breath, chest pain EXAM: PORTABLE CHEST 1 VIEW COMPARISON:  12/07/2011 FINDINGS: Cardiomediastinal silhouette is stable. No acute infiltrate or pleural effusion. No pulmonary edema. Bony thorax is unremarkable. IMPRESSION: No active disease.  Electronically Signed   By: Natasha Mead M.D.   On: 12/25/2015 14:24        Scheduled Meds: . amLODipine  5 mg Oral Daily  . aspirin EC  81 mg Oral Daily  . baclofen  10 mg Oral BID  . budesonide (PULMICORT) nebulizer solution  0.25 mg Nebulization BID  . enoxaparin (LOVENOX) injection  40 mg Subcutaneous Q24H  . folic acid  1 mg Oral Daily  . ipratropium-albuterol  3 mL Nebulization Q6H  . lisinopril  20 mg Oral Daily  . methylPREDNISolone (SOLU-MEDROL) injection  60 mg Intravenous Q12H  . montelukast  10 mg Oral QHS  . multivitamin with minerals  1 tablet Oral Daily  . nicotine  21 mg Transdermal Daily  . pneumococcal 23 valent vaccine  0.5 mL Intramuscular Tomorrow-1000  . sodium chloride flush  3 mL Intravenous Q12H  . thiamine  100 mg Oral Daily   Or  . thiamine  100 mg Intravenous Daily  . venlafaxine XR  150 mg Oral q morning - 10a   Continuous Infusions:    LOS:  1 day    Time spent: 25 minutes. Greater than 50% of this time was spent in direct contact with the patient coordinating care.     Chaya JanHERNANDEZ ACOSTA,Samanda Buske, MD [facility] Pager [phone]  If 7PM-7AM, please contact night-coverage [email or web address] Password [email or web address] 12/26/2015, 6:15 PM

## 2015-12-26 NOTE — Progress Notes (Signed)
**Note De-identified  Obfuscation** EKG completed and placed in patient's chart

## 2015-12-26 NOTE — Progress Notes (Signed)
Normal echo. No further cardiac workup at this time, pancreatitis management per primary team   Dominga FerryJ Nalani Andreen MD

## 2015-12-27 LAB — BASIC METABOLIC PANEL
ANION GAP: 4 — AB (ref 5–15)
BUN: 15 mg/dL (ref 6–20)
CALCIUM: 8.3 mg/dL — AB (ref 8.9–10.3)
CO2: 28 mmol/L (ref 22–32)
CREATININE: 0.62 mg/dL (ref 0.44–1.00)
Chloride: 114 mmol/L — ABNORMAL HIGH (ref 101–111)
GFR calc non Af Amer: >60 mL/min (ref >60)
Glucose, Bld: 101 mg/dL — ABNORMAL HIGH (ref 65–99)
Potassium: 4.4 mmol/L (ref 3.5–5.1)
SODIUM: 146 mmol/L — AB (ref 135–145)

## 2015-12-27 LAB — CBC
HCT: 38.9 % (ref 36.0–46.0)
HEMOGLOBIN: 13.1 g/dL (ref 12.0–15.0)
MCH: 30.8 pg (ref 26.0–34.0)
MCHC: 33.7 g/dL (ref 30.0–36.0)
MCV: 91.5 fL (ref 78.0–100.0)
PLATELETS: 248 10*3/uL (ref 150–400)
RBC: 4.25 MIL/uL (ref 3.87–5.11)
RDW: 14.8 % (ref 11.5–15.5)
WBC: 12.8 10*3/uL — AB (ref 4.0–10.5)

## 2015-12-27 MED ORDER — ASPIRIN 81 MG PO TBEC
81.0000 mg | DELAYED_RELEASE_TABLET | Freq: Every day | ORAL | Status: DC
Start: 2015-12-27 — End: 2017-06-14

## 2015-12-27 MED ORDER — FOLIC ACID 1 MG PO TABS: 1.0000 mg | ORAL_TABLET | Freq: Every day | ORAL | Status: DC

## 2015-12-27 MED ORDER — AMLODIPINE BESYLATE 5 MG PO TABS: 5.0000 mg | ORAL_TABLET | Freq: Every day | ORAL | 2 refills | Status: DC

## 2015-12-27 MED ORDER — ADULT MULTIVITAMIN W/MINERALS CH: 1.0000 | ORAL_TABLET | Freq: Every day | ORAL | Status: DC

## 2015-12-27 NOTE — Progress Notes (Signed)
**Note De-Identified  Obfuscation** Pre treatment peak flow 200 Post treatment peakflow 280  Moderate effort

## 2015-12-27 NOTE — Discharge Summary (Addendum)
Physician Discharge Summary  Jessy OtoLisa R Zagami ZOX:[id number] DOB: October 17, 1965 DOA: 12/25/2015  PCP: Pearson GrippeJames Kim, MD  Admit date: 12/25/2015 Discharge date: 12/27/2015  Time spent: 45 minutes  Recommendations for Outpatient Follow-up:  -Will be discharged home today. -Advised to follow up with PCP in 2 weeks.   Discharge Diagnoses:  Active Problems:   Chest pain   SOB (shortness of breath)   Pancreatitis, acute   Discharge Condition: Stable and improved  Filed Weights   12/25/15 1349 12/25/15 1712  Weight: 61.2 kg (135 lb) 64 kg (141 lb 1.5 oz)    History of present illness:  As per Dr. Dartha Lodgegbata on 308/338: 50 year old Caucasian female with family history significant for fatal premature MI (Father died in his 3530's from MI), presents with left sided chest pain, pressure like that radiated to LUE. Chest pain was said to be rated about 10/10 earlier in the morning. First troponin is 0.03. EKG reveals poor R wave progression. Patient has SOB and wheezing. Patient has over 30 park years. History of asthma and borderline COPD was reported by the patient. No headache, no neck pain, no GI symptoms or urinary symptoms. CTA chest was negative for PE. Lipase is elevated. Collateral information reveals that patient may be drinking significant amount of alchol.  Hospital Course:   Chest pain -Minimally elevated troponin, ECHO with normal EF and no WMA. -Seen by cardiology; no further work up planned for at present. -CP likely related to acute pancreatitis.  Acute Pancreatitis -Pain is improved, only taking PO pain meds. -Tolerating solids without issue. -May have a developing pancreatic pseudocyst according to CT scan and will need to be followed.  ETOH Abuse -Thiamine/folate. -No evidence of withdrawals while in the hospital.  Procedures:  None   Consultations:  cardiology  Discharge Instructions  Discharge Instructions    Increase activity slowly    Complete by:  As directed        Medication List    TAKE these medications   albuterol 108 (90 Base) MCG/ACT inhaler Commonly known as:  PROVENTIL HFA;VENTOLIN HFA Inhale 1-2 puffs into the lungs every 6 (six) hours as needed for wheezing or shortness of breath.   ALPRAZolam 1 MG tablet Commonly known as:  XANAX Take 1 mg by mouth 2 (two) times daily as needed for sleep or anxiety.   amLODipine 5 MG tablet Commonly known as:  NORVASC Take 1 tablet (5 mg total) by mouth daily.   aspirin 81 MG EC tablet Take 1 tablet (81 mg total) by mouth daily.   baclofen 10 MG tablet Commonly known as:  LIORESAL Take 10 mg by mouth 2 (two) times daily.   folic acid 1 MG tablet Commonly known as:  FOLVITE Take 1 tablet (1 mg total) by mouth daily.   lisinopril-hydrochlorothiazide 10-12.5 MG tablet Commonly known as:  PRINZIDE Take 1 tablet by mouth daily.   multivitamin with minerals Tabs tablet Take 1 tablet by mouth daily.   oxyCODONE-acetaminophen 5-325 MG tablet Commonly known as:  PERCOCET/ROXICET Take 1 tablet by mouth every 6 (six) hours as needed for moderate pain or severe pain.   venlafaxine XR 150 MG 24 hr capsule Commonly known as:  EFFEXOR-XR Take 150 mg by mouth every morning.      Allergies  Allergen Reactions  . Sulfa Antibiotics Anaphylaxis      The results of significant diagnostics from this hospitalization (including imaging, microbiology, ancillary and laboratory) are listed below for reference.  Significant Diagnostic Studies: Ct Angio Chest Pe W Or Wo Contrast  Result Date: 12/25/2015 CLINICAL DATA:  50 year old female with chest pain, bilateral arm pain, shortness of breath and dizziness EXAM: CT ANGIOGRAPHY CHEST WITH CONTRAST TECHNIQUE: Multidetector CT imaging of the chest was performed using the standard protocol during bolus administration of intravenous contrast. Multiplanar CT image reconstructions and MIPs were obtained to evaluate the vascular anatomy. CONTRAST:  100 mL  Isovue 370 COMPARISON:  Prior chest x-ray 12/25/2015 FINDINGS: Mediastinum: Unremarkable CT appearance of the thyroid gland. No suspicious mediastinal or hilar adenopathy. No soft tissue mediastinal mass. The thoracic esophagus is unremarkable. Heart/Vascular: Adequate opacification of the pulmonary arteries to the proximal subsegmental level. No evidence of acute pulmonary embolus. Conventional 3 vessel arch anatomy. No evidence of aortic dissection or aneurysm. The heart is normal in size. No pericardial effusion. Small ductus bump the expected location. Lungs/Pleura: New small 3 mm pulmonary nodule in the anterior right upper lobe (image 42 series 7). Otherwise, the lungs are clear. Trace dependent atelectasis. Bones/Soft Tissues: No acute fracture or aggressive appearing lytic or blastic osseous lesion. Upper Abdomen: Punctate calcifications in the spleen consistent with sequelae of old granulomatous disease. There is a small amount of fluid in the gastrosplenic ligament extending inferiorly to surround the pancreatic tail. Evaluation of this region is somewhat limited by respiratory motion. Review of the MIP images confirms the above findings. IMPRESSION: 1. Negative for acute pulmonary embolus, pneumonia or other acute cardiopulmonary process. 2. Nonspecific fluid in the region of the pancreatic tail and gastrosplenic ligament. This could represent incompletely imaged acute pancreatitis. If clinically warranted, consider CT scan of the abdomen and pelvis with contrast. 3. Small splenic calcifications likely represent old granulomatous disease. 4. Nonspecific 3 mm subpleural pulmonary nodule in the anterior right lower lobe. No follow-up needed if patient is low-risk. Non-contrast chest CT can be considered in 12 months if patient is high-risk. This recommendation follows the consensus statement: Guidelines for Management of Incidental Pulmonary Nodules Detected on CT Images:From the Fleischner Society 2017;  published online before print (10.1148/radiol.[id number]). Electronically Signed   By: Malachy Moan M.D.   On: 12/25/2015 16:10   Ct Abdomen W Contrast  Result Date: 12/26/2015 CLINICAL DATA:  Pancreatitis.  Acute.  Nausea.  Shortness of breath. EXAM: CT ABDOMEN WITH CONTRAST TECHNIQUE: Multidetector CT imaging of the abdomen was performed using the standard protocol following bolus administration of intravenous contrast. CONTRAST:  ISOVUE-300 IOPAMIDOL (ISOVUE-300) INJECTION 61% COMPARISON:  Chest CT of 1 day prior. FINDINGS: Lower chest: Clear lung bases. Normal heart size without pericardial or pleural effusion. Hepatobiliary: Probable mild hepatic steatosis. Focal steatosis adjacent the falciform ligament. Gallbladder distension, without surrounding inflammation or biliary duct dilatation. Pancreas: Mild to moderate peripancreatic edema again identified. The pancreas enhances homogeneously. No pancreatic duct dilatation or obstructive stone. Somewhat more focal fluid ventral and inferior to the pancreatic body/ tail junction measures on the order of 3.2 x 2.3 cm on image 30/ series 3. Spleen: Old granulomatous disease within. Adrenals/Urinary Tract: Normal left adrenal gland. Right adrenal thickening and mild nodularity, 1.4 cm image 26/series 3. 6 mm left renal lesion is fat density and represents an angiomyolipoma on image 35/ series 3. Minimal right-sided caliectasis without hydroureter. Stomach/Bowel: Normal stomach, without wall thickening. Normal imaged colon. Normal abdominal small bowel. Vascular/Lymphatic: Age advanced aortic and branch vessel atherosclerosis. Circumaortic left renal vein. Patent portal and splenic veins. No retroperitoneal or retrocrural adenopathy. Other: No ascites. Musculoskeletal: Disc bulge at the lumbosacral  junction. IMPRESSION: 1. Mild to moderate  pancreatitis. 2. Somewhat more focal fluid adjacent to the pancreatic tail. Cannot exclude developing fluid  collection/early pseudocyst. Depending on clinical symptomatology, imaging follow-up should be considered. 3. Gallbladder distention without biliary duct dilatation or choledocholithiasis. 4. Age advanced atherosclerosis.  Aortic atherosclerosis. 5. Probable hepatic steatosis. 6. Right adrenal nodule which is technically indeterminate but most likely an adenoma. Especially if there is any history of primary malignancy, consider dedicated nonemergent adrenal protocol CT. Electronically Signed   By: Jeronimo Greaves M.D.   On: 12/26/2015 11:51   Dg Chest Port 1 View  Result Date: 12/25/2015 CLINICAL DATA:  Shortness of breath, chest pain EXAM: PORTABLE CHEST 1 VIEW COMPARISON:  12/07/2011 FINDINGS: Cardiomediastinal silhouette is stable. No acute infiltrate or pleural effusion. No pulmonary edema. Bony thorax is unremarkable. IMPRESSION: No active disease. Electronically Signed   By: Natasha Mead M.D.   On: 12/25/2015 14:24    Microbiology: No results found for this or any previous visit (from the past 240 hour(s)).   Labs: Basic Metabolic Panel:  Recent Labs Lab 12/25/15 1404 12/25/15 1722 12/26/15 0532 12/27/15 0515  NA 138  --  136 146*  K 3.5  --  4.1 4.4  CL 100*  --  96* 114*  CO2 28  --  29 28  GLUCOSE 98  --  128* 101*  BUN 13  --  9 15  CREATININE 0.78  --  0.57 0.62  CALCIUM 8.6*  --  9.1 8.3*  MG  --  1.5*  --   --   PHOS  --  3.3  --   --    Liver Function Tests:  Recent Labs Lab 12/26/15 0532  AST 35  ALT 27  ALKPHOS 59  BILITOT 0.4  PROT 7.0  ALBUMIN 4.3    Recent Labs Lab 12/25/15 1722  LIPASE 189*   No results for input(s): AMMONIA in the last 168 hours. CBC:  Recent Labs Lab 12/25/15 1404 12/26/15 0532 12/27/15 0515  WBC 9.7 10.8* 12.8*  NEUTROABS 4.7  --   --   HGB 13.9 12.3 13.1  HCT 41.7 36.2 38.9  MCV 93.7 92.1 91.5  PLT 283 291 248   Cardiac Enzymes:  Recent Labs Lab 12/25/15 1404 12/25/15 1722 12/25/15 2345 12/26/15 0532  TROPONINI  0.03* 0.03* 0.03* 0.03*   BNP: BNP (last 3 results) No results for input(s): BNP in the last 8760 hours.  ProBNP (last 3 results) No results for input(s): PROBNP in the last 8760 hours.  CBG: No results for input(s): GLUCAP in the last 168 hours.     SignedChaya Jan  [facility] Pager: [phone] 12/27/2015, 2:20 PM

## 2015-12-27 NOTE — Progress Notes (Signed)
Patient being d/c home with instructions. IV cath removed and in tact. No c/o pain/swelling at site and at this time. Noticed large bruise area on patient left upper arm. Unsure if it was there prior to admission because today was the first day I had her. Patient waiting on family member for ride.

## 2016-09-03 ENCOUNTER — Other Ambulatory Visit (HOSPITAL_COMMUNITY): Payer: Self-pay | Admitting: Internal Medicine

## 2016-09-03 DIAGNOSIS — R112 Nausea with vomiting, unspecified: Secondary | ICD-10-CM

## 2017-02-19 ENCOUNTER — Other Ambulatory Visit (HOSPITAL_COMMUNITY): Payer: Self-pay | Admitting: Family Medicine

## 2017-02-19 DIAGNOSIS — Z1231 Encounter for screening mammogram for malignant neoplasm of breast: Secondary | ICD-10-CM

## 2017-02-19 DIAGNOSIS — Z780 Asymptomatic menopausal state: Secondary | ICD-10-CM

## 2017-02-25 ENCOUNTER — Inpatient Hospital Stay (HOSPITAL_COMMUNITY)
Admission: RE | Admit: 2017-02-25 | Discharge: 2017-02-25 | Disposition: A | Discharge status: Home / Self Care | Payer: Self-pay | Source: Ambulatory Visit | Attending: Family Medicine | Admitting: Family Medicine

## 2017-02-25 ENCOUNTER — Ambulatory Visit (HOSPITAL_COMMUNITY): Payer: Self-pay

## 2017-02-25 ENCOUNTER — Encounter (HOSPITAL_COMMUNITY): Payer: Self-pay

## 2017-06-14 ENCOUNTER — Emergency Department (HOSPITAL_COMMUNITY): Payer: Self-pay

## 2017-06-14 ENCOUNTER — Inpatient Hospital Stay (HOSPITAL_COMMUNITY): Payer: Self-pay

## 2017-06-14 ENCOUNTER — Other Ambulatory Visit: Payer: Self-pay

## 2017-06-14 ENCOUNTER — Encounter (HOSPITAL_COMMUNITY): Payer: Self-pay

## 2017-06-14 ENCOUNTER — Inpatient Hospital Stay (HOSPITAL_COMMUNITY)
Admission: EM | Admit: 2017-06-14 | Discharge: 2017-06-17 | DRG: 641 | Disposition: A | Discharge status: Home / Self Care | Payer: Self-pay | Attending: Internal Medicine | Admitting: Internal Medicine

## 2017-06-14 DIAGNOSIS — F17210 Nicotine dependence, cigarettes, uncomplicated: Secondary | ICD-10-CM | POA: Diagnosis present

## 2017-06-14 DIAGNOSIS — D649 Anemia, unspecified: Secondary | ICD-10-CM

## 2017-06-14 DIAGNOSIS — R1115 Cyclical vomiting syndrome unrelated to migraine: Secondary | ICD-10-CM

## 2017-06-14 DIAGNOSIS — J45909 Unspecified asthma, uncomplicated: Secondary | ICD-10-CM | POA: Diagnosis present

## 2017-06-14 DIAGNOSIS — F4310 Post-traumatic stress disorder, unspecified: Secondary | ICD-10-CM | POA: Diagnosis present

## 2017-06-14 DIAGNOSIS — F329 Major depressive disorder, single episode, unspecified: Secondary | ICD-10-CM | POA: Diagnosis present

## 2017-06-14 DIAGNOSIS — G8929 Other chronic pain: Secondary | ICD-10-CM

## 2017-06-14 DIAGNOSIS — E46 Unspecified protein-calorie malnutrition: Secondary | ICD-10-CM | POA: Diagnosis present

## 2017-06-14 DIAGNOSIS — K2970 Gastritis, unspecified, without bleeding: Secondary | ICD-10-CM | POA: Diagnosis present

## 2017-06-14 DIAGNOSIS — M797 Fibromyalgia: Secondary | ICD-10-CM | POA: Diagnosis present

## 2017-06-14 DIAGNOSIS — R531 Weakness: Secondary | ICD-10-CM

## 2017-06-14 DIAGNOSIS — F32A Depression, unspecified: Secondary | ICD-10-CM | POA: Diagnosis present

## 2017-06-14 DIAGNOSIS — E876 Hypokalemia: Principal | ICD-10-CM | POA: Diagnosis present

## 2017-06-14 DIAGNOSIS — E538 Deficiency of other specified B group vitamins: Secondary | ICD-10-CM | POA: Diagnosis present

## 2017-06-14 DIAGNOSIS — R9431 Abnormal electrocardiogram [ECG] [EKG]: Secondary | ICD-10-CM | POA: Diagnosis present

## 2017-06-14 DIAGNOSIS — E8809 Other disorders of plasma-protein metabolism, not elsewhere classified: Secondary | ICD-10-CM | POA: Diagnosis present

## 2017-06-14 DIAGNOSIS — E860 Dehydration: Secondary | ICD-10-CM | POA: Diagnosis present

## 2017-06-14 DIAGNOSIS — D509 Iron deficiency anemia, unspecified: Secondary | ICD-10-CM | POA: Diagnosis present

## 2017-06-14 DIAGNOSIS — K8689 Other specified diseases of pancreas: Secondary | ICD-10-CM | POA: Diagnosis present

## 2017-06-14 DIAGNOSIS — Z882 Allergy status to sulfonamides status: Secondary | ICD-10-CM

## 2017-06-14 DIAGNOSIS — K219 Gastro-esophageal reflux disease without esophagitis: Secondary | ICD-10-CM | POA: Diagnosis present

## 2017-06-14 DIAGNOSIS — R64 Cachexia: Secondary | ICD-10-CM | POA: Diagnosis present

## 2017-06-14 DIAGNOSIS — Z90710 Acquired absence of both cervix and uterus: Secondary | ICD-10-CM

## 2017-06-14 DIAGNOSIS — Z681 Body mass index (BMI) 19 or less, adult: Secondary | ICD-10-CM

## 2017-06-14 DIAGNOSIS — I10 Essential (primary) hypertension: Secondary | ICD-10-CM | POA: Diagnosis present

## 2017-06-14 DIAGNOSIS — F1010 Alcohol abuse, uncomplicated: Secondary | ICD-10-CM | POA: Diagnosis present

## 2017-06-14 DIAGNOSIS — R634 Abnormal weight loss: Secondary | ICD-10-CM | POA: Diagnosis present

## 2017-06-14 DIAGNOSIS — K760 Fatty (change of) liver, not elsewhere classified: Secondary | ICD-10-CM | POA: Diagnosis present

## 2017-06-14 DIAGNOSIS — F17200 Nicotine dependence, unspecified, uncomplicated: Secondary | ICD-10-CM | POA: Diagnosis present

## 2017-06-14 DIAGNOSIS — Z800 Family history of malignant neoplasm of digestive organs: Secondary | ICD-10-CM

## 2017-06-14 DIAGNOSIS — F1020 Alcohol dependence, uncomplicated: Secondary | ICD-10-CM | POA: Diagnosis present

## 2017-06-14 DIAGNOSIS — R109 Unspecified abdominal pain: Secondary | ICD-10-CM

## 2017-06-14 DIAGNOSIS — R000 Tachycardia, unspecified: Secondary | ICD-10-CM | POA: Diagnosis present

## 2017-06-14 HISTORY — DX: Acute pancreatitis without necrosis or infection, unspecified: K85.90

## 2017-06-14 HISTORY — DX: Post-traumatic stress disorder, unspecified: F43.10

## 2017-06-14 HISTORY — DX: Fibromyalgia: M79.7

## 2017-06-14 LAB — CBC WITH DIFFERENTIAL/PLATELET
Basophils Absolute: 0 10*3/uL (ref 0.0–0.1)
Basophils Relative: 0 %
Eosinophils Absolute: 0 10*3/uL (ref 0.0–0.7)
Eosinophils Relative: 0 %
HEMATOCRIT: 27.8 % — AB (ref 36.0–46.0)
HEMOGLOBIN: 8.7 g/dL — AB (ref 12.0–15.0)
LYMPHS ABS: 2.3 10*3/uL (ref 0.7–4.0)
Lymphocytes Relative: 31 %
MCH: 34.7 pg — AB (ref 26.0–34.0)
MCHC: 31.3 g/dL (ref 30.0–36.0)
MCV: 110.8 fL — AB (ref 78.0–100.0)
MONO ABS: 0.5 10*3/uL (ref 0.1–1.0)
Monocytes Relative: 7 %
NEUTROS ABS: 4.4 10*3/uL (ref 1.7–7.7)
NEUTROS PCT: 62 %
Platelets: 228 10*3/uL (ref 150–400)
RBC: 2.51 MIL/uL — ABNORMAL LOW (ref 3.87–5.11)
RDW: 14.2 % (ref 11.5–15.5)
WBC: 7.2 10*3/uL (ref 4.0–10.5)

## 2017-06-14 LAB — TSH: TSH: 2.469 u[IU]/mL (ref 0.350–4.500)

## 2017-06-14 LAB — URINALYSIS, ROUTINE W REFLEX MICROSCOPIC
Bilirubin Urine: NEGATIVE
Glucose, UA: NEGATIVE mg/dL
HGB URINE DIPSTICK: NEGATIVE
Ketones, ur: NEGATIVE mg/dL
Leukocytes, UA: NEGATIVE
Nitrite: NEGATIVE
Protein, ur: NEGATIVE mg/dL
SPECIFIC GRAVITY, URINE: 1.016 (ref 1.005–1.030)
pH: 6 (ref 5.0–8.0)

## 2017-06-14 LAB — LIPASE, BLOOD: Lipase: 15 U/L (ref 11–51)

## 2017-06-14 LAB — COMPREHENSIVE METABOLIC PANEL
ALK PHOS: 229 U/L — AB (ref 38–126)
ALT: 55 U/L — ABNORMAL HIGH (ref 14–54)
ANION GAP: 15 (ref 5–15)
AST: 77 U/L — ABNORMAL HIGH (ref 15–41)
Albumin: 1.7 g/dL — ABNORMAL LOW (ref 3.5–5.0)
BILIRUBIN TOTAL: 1.6 mg/dL — AB (ref 0.3–1.2)
BUN: <5 mg/dL — ABNORMAL LOW (ref 6–20)
CALCIUM: 7.7 mg/dL — AB (ref 8.9–10.3)
CO2: 25 mmol/L (ref 22–32)
Chloride: 96 mmol/L — ABNORMAL LOW (ref 101–111)
Creatinine, Ser: 0.51 mg/dL (ref 0.44–1.00)
Glucose, Bld: 104 mg/dL — ABNORMAL HIGH (ref 65–99)
POTASSIUM: 2.5 mmol/L — AB (ref 3.5–5.1)
Sodium: 136 mmol/L (ref 135–145)
TOTAL PROTEIN: 4.8 g/dL — AB (ref 6.5–8.1)

## 2017-06-14 LAB — TROPONIN I

## 2017-06-14 LAB — RETICULOCYTES
RBC.: 2.37 MIL/uL — ABNORMAL LOW (ref 3.87–5.11)
RETIC COUNT ABSOLUTE: 132.7 10*3/uL (ref 19.0–186.0)
Retic Ct Pct: 5.6 % — ABNORMAL HIGH (ref 0.4–3.1)

## 2017-06-14 LAB — BRAIN NATRIURETIC PEPTIDE: B NATRIURETIC PEPTIDE 5: 109 pg/mL — AB (ref 0.0–100.0)

## 2017-06-14 LAB — SEDIMENTATION RATE: Sed Rate: 2 mm/hr (ref 0–22)

## 2017-06-14 LAB — MAGNESIUM: Magnesium: 1.4 mg/dL — ABNORMAL LOW (ref 1.7–2.4)

## 2017-06-14 MED ORDER — POTASSIUM CHLORIDE 10 MEQ/100ML IV SOLN
10.0000 meq | INTRAVENOUS | Status: AC
  Administered 2017-06-14 (×3): 10 meq via INTRAVENOUS
  Filled 2017-06-14 (×3): qty 100

## 2017-06-14 MED ORDER — LISINOPRIL 10 MG PO TABS
20.0000 mg | ORAL_TABLET | Freq: Every day | ORAL | Status: DC
  Administered 2017-06-14 – 2017-06-17 (×4): 20 mg via ORAL
  Filled 2017-06-14 (×4): qty 2

## 2017-06-14 MED ORDER — PANCRELIPASE (LIP-PROT-AMYL) 36000-114000 UNITS PO CPEP
72000.0000 [IU] | ORAL_CAPSULE | Freq: Three times a day (TID) | ORAL | Status: DC
  Administered 2017-06-15 – 2017-06-16 (×4): 72000 [IU] via ORAL
  Filled 2017-06-14 (×6): qty 2

## 2017-06-14 MED ORDER — LORAZEPAM 1 MG PO TABS
0.0000 mg | ORAL_TABLET | Freq: Two times a day (BID) | ORAL | Status: DC
  Administered 2017-06-17: 1 mg via ORAL
  Filled 2017-06-14: qty 1

## 2017-06-14 MED ORDER — SODIUM CHLORIDE 0.9 % IV SOLN
INTRAVENOUS | Status: AC
  Administered 2017-06-14 – 2017-06-15 (×2): via INTRAVENOUS

## 2017-06-14 MED ORDER — IOPAMIDOL (ISOVUE-300) INJECTION 61%
100.0000 mL | Freq: Once | INTRAVENOUS | Status: AC | PRN
  Administered 2017-06-14: 100 mL via INTRAVENOUS

## 2017-06-14 MED ORDER — LORAZEPAM 2 MG/ML IJ SOLN
1.0000 mg | Freq: Once | INTRAMUSCULAR | Status: AC
  Administered 2017-06-14: 1 mg via INTRAVENOUS
  Filled 2017-06-14: qty 1

## 2017-06-14 MED ORDER — MAGNESIUM SULFATE 2 GM/50ML IV SOLN
2.0000 g | Freq: Once | INTRAVENOUS | Status: AC
  Administered 2017-06-14: 2 g via INTRAVENOUS
  Filled 2017-06-14: qty 50

## 2017-06-14 MED ORDER — SODIUM CHLORIDE 0.9 % IV BOLUS (SEPSIS)
1000.0000 mL | Freq: Once | INTRAVENOUS | Status: AC
  Administered 2017-06-14: 1000 mL via INTRAVENOUS

## 2017-06-14 MED ORDER — IPRATROPIUM-ALBUTEROL 0.5-2.5 (3) MG/3ML IN SOLN
3.0000 mL | Freq: Four times a day (QID) | RESPIRATORY_TRACT | Status: DC | PRN
Start: 2017-06-14 — End: 2017-06-17

## 2017-06-14 MED ORDER — NICOTINE 14 MG/24HR TD PT24
14.0000 mg | MEDICATED_PATCH | Freq: Every day | TRANSDERMAL | Status: DC | PRN
  Administered 2017-06-15 – 2017-06-17 (×2): 14 mg via TRANSDERMAL
  Filled 2017-06-14 (×2): qty 1

## 2017-06-14 MED ORDER — POTASSIUM CHLORIDE CRYS ER 20 MEQ PO TBCR
40.0000 meq | EXTENDED_RELEASE_TABLET | Freq: Once | ORAL | Status: AC
  Administered 2017-06-14: 40 meq via ORAL
  Filled 2017-06-14: qty 2

## 2017-06-14 MED ORDER — OXYCODONE-ACETAMINOPHEN 7.5-325 MG PO TABS
1.0000 | ORAL_TABLET | Freq: Four times a day (QID) | ORAL | Status: DC | PRN
  Administered 2017-06-15: 1 via ORAL
  Filled 2017-06-14 (×2): qty 1

## 2017-06-14 MED ORDER — RANITIDINE HCL 150 MG/10ML PO SYRP
150.0000 mg | ORAL_SOLUTION | Freq: Two times a day (BID) | ORAL | Status: DC
  Administered 2017-06-14 – 2017-06-15 (×2): 150 mg via ORAL
  Filled 2017-06-14 (×7): qty 10

## 2017-06-14 MED ORDER — ADULT MULTIVITAMIN W/MINERALS CH
1.0000 | ORAL_TABLET | Freq: Every day | ORAL | Status: DC
  Administered 2017-06-15 – 2017-06-17 (×3): 1 via ORAL
  Filled 2017-06-14 (×4): qty 1

## 2017-06-14 MED ORDER — LORAZEPAM 1 MG PO TABS
0.0000 mg | ORAL_TABLET | Freq: Four times a day (QID) | ORAL | Status: AC
  Administered 2017-06-14: 2 mg via ORAL
  Administered 2017-06-15: 3 mg via ORAL
  Administered 2017-06-15: 1 mg via ORAL
  Administered 2017-06-15 – 2017-06-16 (×3): 2 mg via ORAL
  Filled 2017-06-14: qty 2
  Filled 2017-06-14: qty 1
  Filled 2017-06-14 (×4): qty 2

## 2017-06-14 MED ORDER — LORAZEPAM 1 MG PO TABS
1.0000 mg | ORAL_TABLET | Freq: Four times a day (QID) | ORAL | Status: DC | PRN
  Administered 2017-06-16: 1 mg via ORAL
  Filled 2017-06-14 (×2): qty 1

## 2017-06-14 MED ORDER — VENLAFAXINE HCL ER 37.5 MG PO CP24: 75.0000 mg | ORAL_CAPSULE | Freq: Every day | ORAL | Status: DC

## 2017-06-14 MED ORDER — METOCLOPRAMIDE HCL 5 MG/ML IJ SOLN: 10.0000 mg | Freq: Once | INTRAMUSCULAR | Status: DC

## 2017-06-14 MED ORDER — FOLIC ACID 1 MG PO TABS
1.0000 mg | ORAL_TABLET | Freq: Every day | ORAL | Status: DC
  Administered 2017-06-14 – 2017-06-17 (×4): 1 mg via ORAL
  Filled 2017-06-14 (×4): qty 1

## 2017-06-14 MED ORDER — ACETAMINOPHEN 325 MG PO TABS
650.0000 mg | ORAL_TABLET | Freq: Once | ORAL | Status: AC
  Administered 2017-06-14: 650 mg via ORAL
  Filled 2017-06-14: qty 2

## 2017-06-14 MED ORDER — THIAMINE HCL 100 MG/ML IJ SOLN
100.0000 mg | Freq: Every day | INTRAMUSCULAR | Status: DC
  Administered 2017-06-14: 100 mg via INTRAVENOUS
  Filled 2017-06-14: qty 2

## 2017-06-14 MED ORDER — HEPARIN SODIUM (PORCINE) 5000 UNIT/ML IJ SOLN
5000.0000 [IU] | Freq: Three times a day (TID) | INTRAMUSCULAR | Status: AC
  Administered 2017-06-14 – 2017-06-15 (×4): 5000 [IU] via SUBCUTANEOUS
  Filled 2017-06-14 (×4): qty 1

## 2017-06-14 MED ORDER — LORAZEPAM 2 MG/ML IJ SOLN: 1.0000 mg | Freq: Four times a day (QID) | INTRAMUSCULAR | Status: DC | PRN

## 2017-06-14 MED ORDER — VITAMIN B-1 100 MG PO TABS
100.0000 mg | ORAL_TABLET | Freq: Every day | ORAL | Status: DC
  Administered 2017-06-15 – 2017-06-17 (×3): 100 mg via ORAL
  Filled 2017-06-14 (×3): qty 1
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
## 2017-06-14 NOTE — ED Notes (Signed)
Pt returned from xray

## 2017-06-14 NOTE — ED Notes (Signed)
CRITICAL VALUE ALERT  Critical Value:  K 2.5  Date & Time Notied:  06/14/17  1815  Provider Notified: Mesner  Orders Received/Actions taken: new orders
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
## 2017-06-14 NOTE — ED Triage Notes (Addendum)
Patient reports of bilateral leg swelling x2 weeks. States she has purulent drainage leaking from legs. Denies pain. Patient appears sluggish, although easily arousable.  NAD noted in triage.

## 2017-06-15 ENCOUNTER — Encounter (HOSPITAL_COMMUNITY): Payer: Self-pay | Admitting: *Deleted

## 2017-06-15 DIAGNOSIS — D649 Anemia, unspecified: Secondary | ICD-10-CM

## 2017-06-15 DIAGNOSIS — R109 Unspecified abdominal pain: Secondary | ICD-10-CM

## 2017-06-15 DIAGNOSIS — E876 Hypokalemia: Principal | ICD-10-CM

## 2017-06-15 DIAGNOSIS — G8929 Other chronic pain: Secondary | ICD-10-CM

## 2017-06-15 DIAGNOSIS — R634 Abnormal weight loss: Secondary | ICD-10-CM

## 2017-06-15 LAB — LIPID PANEL
Cholesterol: 155 mg/dL (ref 0–200)
Triglycerides: 241 mg/dL — ABNORMAL HIGH (ref <150)
VLDL: 48 mg/dL — AB (ref 0–40)

## 2017-06-15 LAB — RAPID URINE DRUG SCREEN, HOSP PERFORMED
Amphetamines: NOT DETECTED
BARBITURATES: NOT DETECTED
Benzodiazepines: POSITIVE — AB
Cocaine: NOT DETECTED
OPIATES: NOT DETECTED
TETRAHYDROCANNABINOL: NOT DETECTED

## 2017-06-15 LAB — COMPREHENSIVE METABOLIC PANEL
ALK PHOS: 224 U/L — AB (ref 38–126)
ALT: 52 U/L (ref 14–54)
AST: 100 U/L — AB (ref 15–41)
Albumin: 1.6 g/dL — ABNORMAL LOW (ref 3.5–5.0)
Anion gap: 13 (ref 5–15)
BILIRUBIN TOTAL: 1.9 mg/dL — AB (ref 0.3–1.2)
CALCIUM: 7.1 mg/dL — AB (ref 8.9–10.3)
CO2: 22 mmol/L (ref 22–32)
CREATININE: 0.4 mg/dL — AB (ref 0.44–1.00)
Chloride: 99 mmol/L — ABNORMAL LOW (ref 101–111)
Glucose, Bld: 127 mg/dL — ABNORMAL HIGH (ref 65–99)
Potassium: 3.4 mmol/L — ABNORMAL LOW (ref 3.5–5.1)
Sodium: 134 mmol/L — ABNORMAL LOW (ref 135–145)
TOTAL PROTEIN: 4.4 g/dL — AB (ref 6.5–8.1)

## 2017-06-15 LAB — CBC
HCT: 23.2 % — ABNORMAL LOW (ref 36.0–46.0)
Hemoglobin: 7.5 g/dL — ABNORMAL LOW (ref 12.0–15.0)
MCH: 34.4 pg — ABNORMAL HIGH (ref 26.0–34.0)
MCHC: 32.3 g/dL (ref 30.0–36.0)
MCV: 106.4 fL — AB (ref 78.0–100.0)
PLATELETS: 208 10*3/uL (ref 150–400)
RBC: 2.18 MIL/uL — AB (ref 3.87–5.11)
RDW: 13.9 % (ref 11.5–15.5)
WBC: 6.8 10*3/uL (ref 4.0–10.5)

## 2017-06-15 LAB — IRON AND TIBC: IRON: 65 ug/dL (ref 28–170)

## 2017-06-15 LAB — ABO/RH: ABO/RH(D): A POS

## 2017-06-15 LAB — FOLATE: FOLATE: 3 ng/mL — AB (ref >5.9)

## 2017-06-15 LAB — FERRITIN: Ferritin: 386 ng/mL — ABNORMAL HIGH (ref 11–307)

## 2017-06-15 LAB — VITAMIN B12: Vitamin B-12: 1866 pg/mL — ABNORMAL HIGH (ref 180–914)

## 2017-06-15 LAB — PROTIME-INR
INR: 1.26
Prothrombin Time: 15.7 seconds — ABNORMAL HIGH (ref 11.4–15.2)

## 2017-06-15 LAB — AMMONIA: Ammonia: 32 umol/L (ref 9–35)

## 2017-06-15 LAB — MAGNESIUM: MAGNESIUM: 1.6 mg/dL — AB (ref 1.7–2.4)

## 2017-06-15 LAB — C-REACTIVE PROTEIN: CRP: 4 mg/dL — ABNORMAL HIGH (ref <1.0)

## 2017-06-15 LAB — APTT: APTT: 32 s (ref 24–36)

## 2017-06-15 LAB — PREPARE RBC (CROSSMATCH)

## 2017-06-15 MED ORDER — ACETAMINOPHEN 325 MG PO TABS
650.0000 mg | ORAL_TABLET | Freq: Four times a day (QID) | ORAL | Status: DC | PRN
  Administered 2017-06-15 – 2017-06-16 (×2): 650 mg via ORAL
  Filled 2017-06-15 (×2): qty 2

## 2017-06-15 MED ORDER — SODIUM CHLORIDE 0.9 % IV SOLN
Freq: Once | INTRAVENOUS | Status: AC
  Administered 2017-06-16: via INTRAVENOUS

## 2017-06-15 MED ORDER — MAGNESIUM SULFATE 4 GM/100ML IV SOLN: 4.0000 g | Freq: Once | INTRAVENOUS | Status: DC

## 2017-06-15 MED ORDER — PANTOPRAZOLE SODIUM 40 MG PO TBEC
40.0000 mg | DELAYED_RELEASE_TABLET | Freq: Every day | ORAL | Status: DC
  Administered 2017-06-15 – 2017-06-16 (×2): 40 mg via ORAL
  Filled 2017-06-15 (×2): qty 1

## 2017-06-15 MED ORDER — POTASSIUM CHLORIDE CRYS ER 20 MEQ PO TBCR
40.0000 meq | EXTENDED_RELEASE_TABLET | Freq: Once | ORAL | Status: AC
  Administered 2017-06-15: 40 meq via ORAL
  Filled 2017-06-15: qty 2

## 2017-06-15 MED ORDER — MAGNESIUM SULFATE 2 GM/50ML IV SOLN
2.0000 g | INTRAVENOUS | Status: AC
  Administered 2017-06-15 (×2): 2 g via INTRAVENOUS
  Filled 2017-06-15 (×2): qty 50

## 2017-06-15 MED ORDER — ENSURE ENLIVE PO LIQD: 237.0000 mL | Freq: Two times a day (BID) | ORAL | Status: DC

## 2017-06-15 NOTE — Progress Notes (Signed)
Initial Nutrition Assessment  DOCUMENTATION CODES:      INTERVENTION:  Ensure Enlive po BID, each supplement provides 350 kcal and 20 grams of protein    NUTRITION DIAGNOSIS:   Inadequate oral intake related to vomiting, nausea as evidenced by per patient/family report.  GOAL: Pt to meet >/= 90% of their estimated nutrition needs    MONITOR:   PO intake, Supplement acceptance, Labs, Weight trends  REASON FOR ASSESSMENT:   Consult Malnutrition Eval  ASSESSMENT: Presents with c/o bilateral lower extremity edema past 2 weeks, increased fatigue and diminished urine output. History includes HTN, PTSD, Depression, Pancreatitis and fibromyalgia. She drinks alcohol daily and smokes 1 ppd cigarettes. She has an order for pancreatic enzymes at home but says they are to be taken as needed.  GI consult in process. RD visited pt and she is out of room- talked with her nurse who thinks she may have left the floor to smoke.  RD returned to  Visit pt later in the morning but limited success in obtaining nutrition hx. Patient awake but avoids eye contact and minimal participation in answering questions. She has "a friend" in the room who says there has been difficulty for her to keep food down and she has been vomiting even with bland foods like jello.   Patient tells RD usual body weight range is 120-130 lb. Expect element of malnutrition given her excessive alcohol intake and smoking habit but unable to diagnosis at this time.   NUTRITION - FOCUSED PHYSICAL EXAM:  Partial exam completed. No acute findings.   Diet Order:  Diet clear liquid Room service appropriate? Yes; Fluid consistency: Thin  EDUCATION NEEDS:   No education needs have been identified at this time Skin:  Skin Assessment: Reviewed RN Assessment  Last BM:  1/27  Height:   Ht Readings from Last 1 Encounters:  06/15/17 5\' 6"  (1.676 m)    Weight:   Wt Readings from Last 1 Encounters:  06/15/17 123 lb 3.8 oz (55.9 kg)     Ideal Body Weight:  59 kg  BMI:  Body mass index is 19.89 kg/m.  Estimated Nutritional Needs:   Kcal:  [id number]  Protein:  67-73 gr  Fluid:  2.0 liters daily   Royann ShiversLynn Mauri Temkin MS,RD,CSG,LDN Office: [phone] Pager: [phone]

## 2017-06-15 NOTE — Clinical Social Work Note (Signed)
Patient states that she does not feel that she has a current alcohol problem. She stated that she was not currently interested in alcohol cessation programs or materials.   LCSW signing off.    Leyana Whidden, Juleen ChinaHeather D, LCSW
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
## 2017-06-15 NOTE — Progress Notes (Signed)
PROGRESS NOTE    Kendra OtoLisa R Cox  ZOX:[id number] DOB: [birth year] DOA: 06/14/2017 PCP: Pearson GrippeKim, James, MD    Brief Narrative:  52 year old female with a history of alcohol abuse, presents to the hospital with leg swelling.  Found to be severely hypokalemic, hypomagnesemic and hypoalbuminemic.  She is also noted to have a significant anemia with hemoglobin down to 7.5.  She was admitted to the hospital for further workup.  She complained of unintentional weight loss with nausea and vomiting.  Seen by GI and plans for endoscopic evaluation on 1/29.   Assessment & Plan:   Principal Problem:   Hypokalemia Active Problems:   Malnutrition (HCC)   Pancreatic insufficiency   Hypoalbuminemia   IDA (iron deficiency anemia)   Depression   PTSD (post-traumatic stress disorder)   Generalized weakness   Abnormal weight loss   Asthma   Hypertension   Fibromyalgia   Sinus tachycardia   Current every day smoker   Chronic alcohol abuse   Hypomagnesemia   Hyperbilirubinemia   Prolonged Q-T interval on ECG   Anemia   Chronic abdominal pain   1. Hypokalemia.  Related to poor p.o. intake and hydrochlorothiazide.  This is being replaced. 2. Hypomagnesemia.  Replace. 3. Chronic abdominal pain with nausea and vomiting.  Seen by gastroenterology.  Plan for endoscopic workup in a.m. 4. Abnormal weight loss.  Likely related to poor p.o. intake.  No obvious malignancy on CT abdomen or chest x-ray.  GI following and will perform endoscopic evaluations in a.m. 5. 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  This could also be contributing to weight loss. 6. Anemia.  Anemia panel indicates folate deficiency.  B12 level is elevated.  Iron is in normal range.  Start the patient on folate replacement.  Suspect is related to alcohol intake.  Hemoglobin is 7.5.  Since procedures are being done under anesthesia in a.m., she will receive 1 unit of PRBC in order to optimize her hemoglobin. 7. Generalized  weakness.  Seen by physical therapy no further therapy was recommended. 8. Hypertension.  Resume home medications.  Blood pressure currently stable. 9. Hypoalbuminemia.  Dietary consult. 10. Chronic alcohol abuse.  On CIWA protocol with Ativan.  Patient does not acknowledge that she has any issues with alcohol abuse. 11. Lower extremity edema.  Suspect this is related to hypoalbuminemia   DVT prophylaxis: Heparin Code Status: Full code Family Communication: No family present Disposition Plan: Discharge home once improved   Consultants:     Procedures:     Antimicrobials:      Subjective: No nausea or vomiting.  No abdominal pain.  No shortness of breath.  Objective: Vitals:   06/15/17 0132 06/15/17 0845 06/15/17 1445 06/15/17 1800  BP: (!) 137/94 114/82 130/81 119/86  Pulse: (!) 106 95 (!) 108 98  Resp:  18 18   Temp: 98.5 F (36.9 C) 98.5 F (36.9 C) 98.9 F (37.2 C)   TempSrc: Oral Oral Oral   SpO2: 98% 98% 100%   Weight: 55.9 kg (123 lb 3.8 oz)     Height: 5\' 6"  (1.676 m)       Intake/Output Summary (Last 24 hours) at 06/15/2017 1851 Last data filed at 06/15/2017 1736 Gross per 24 hour  Intake 2070 ml  Output -  Net 2070 ml   Filed Weights   06/14/17 1444 06/15/17 0132  Weight: 52.6 kg (116 lb) 55.9 kg (123 lb 3.8 oz)    Examination:  General exam: Appears calm and comfortable  Respiratory system: Clear to auscultation. Respiratory effort normal. Cardiovascular system: S1 & S2 heard, RRR. No JVD, murmurs, rubs, gallops or clicks. 1+ pedal edema. Gastrointestinal system: Abdomen is nondistended, soft and nontender. No organomegaly or masses felt. Normal bowel sounds heard. Central nervous system: No focal neurological deficits. Extremities: Symmetric 5 x 5 power. Skin: No rashes, lesions or ulcers Psychiatry: lethargic, falls asleep during conversation     Data Reviewed: I have personally reviewed following labs and imaging studies  CBC: Recent  Labs  Lab 06/14/17 1727 06/15/17 0522  WBC 7.2 6.8  NEUTROABS 4.4  --   HGB 8.7* 7.5*  HCT 27.8* 23.2*  MCV 110.8* 106.4*  PLT 228 208   Basic Metabolic Panel: Recent Labs  Lab 06/14/17 1727 06/15/17 0522  NA 136 134*  K 2.5* 3.4*  CL 96* 99*  CO2 25 22  GLUCOSE 104* 127*  BUN <5* <5*  CREATININE 0.51 0.40*  CALCIUM 7.7* 7.1*  MG 1.4* 1.6*   GFR: Estimated Creatinine Clearance: 72.6 mL/min (A) (by C-G formula based on SCr of 0.4 mg/dL (L)). Liver Function Tests: Recent Labs  Lab 06/14/17 1727 06/15/17 0522  AST 77* 100*  ALT 55* 52  ALKPHOS 229* 224*  BILITOT 1.6* 1.9*  PROT 4.8* 4.4*  ALBUMIN 1.7* 1.6*   Recent Labs  Lab 06/14/17 1727  LIPASE 15   Recent Labs  Lab 06/15/17 1626  AMMONIA 32   Coagulation Profile: Recent Labs  Lab 06/15/17 0522  INR 1.26   Cardiac Enzymes: Recent Labs  Lab 06/14/17 1727  TROPONINI <0.03   BNP (last 3 results) No results for input(s): PROBNP in the last 8760 hours. HbA1C: No results for input(s): HGBA1C in the last 72 hours. CBG: No results for input(s): GLUCAP in the last 168 hours. Lipid Profile: Recent Labs    06/15/17 0522  CHOL 155  HDL <10*  LDLCALC NOT CALCULATED  TRIG 241*  CHOLHDL NOT CALCULATED   Thyroid Function Tests: Recent Labs    06/14/17 1930  TSH 2.469   Anemia Panel: Recent Labs    06/14/17 1930  VITAMINB12 1,866*  FOLATE 3.0*  FERRITIN 386*  TIBC NOT CALCULATED  IRON 65  RETICCTPCT 5.6*   Sepsis Labs: No results for input(s): PROCALCITON, LATICACIDVEN in the last 168 hours.  No results found for this or any previous visit (from the past 240 hour(s)).       Radiology Studies: Dg Chest 2 View  Result Date: 06/14/2017 CLINICAL DATA:  BILATERAL leg swelling for 2 weeks question pulmonary edema, history asthma, smoking EXAM: CHEST  2 VIEW COMPARISON:  12/25/2015 FINDINGS: Normal heart size, mediastinal contours, and pulmonary vascularity. Lungs minimally  hyperinflated but clear. No infiltrate, pleural effusion or pneumothorax. Bones unremarkable. BILATERAL nipple shadows noted. IMPRESSION: No acute abnormalities. Electronically Signed   By: Ulyses Southward M.D.   On: 06/14/2017 18:09   Ct Abdomen Pelvis W Contrast  Result Date: 06/14/2017 CLINICAL DATA:  Abdominal distention, nausea and vomiting EXAM: CT ABDOMEN AND PELVIS WITH CONTRAST TECHNIQUE: Multidetector CT imaging of the abdomen and pelvis was performed using the standard protocol following bolus administration of intravenous contrast. CONTRAST:  ISOVUE-300 IOPAMIDOL (ISOVUE-300) INJECTION 61% COMPARISON:  CT abdomen pelvis 12/26/2015 FINDINGS: Lower chest: No basilar pulmonary nodules or pleural effusion. No apical pericardial effusion. Hepatobiliary: Diffuse hepatic steatosis. No focal liver lesion. There is mild fatty sparing at the gallbladder fossa. Normal gallbladder. Pancreas: Normal parenchymal contours without ductal dilatation. No peripancreatic fluid collection. Spleen: The  spleen is partially atrophic. There are multiple coils along the course of the splenic artery. Adrenals/Urinary Tract: --Adrenal glands: Unchanged appearance of right adrenal lesion measuring 1.5 x 0.7 cm with an intermediate attenuation value. Normal left adrenal gland. --Right kidney/ureter: No hydronephrosis, perinephric stranding or nephrolithiasis. No obstructing ureteral stones. --Left kidney/ureter: No hydronephrosis, perinephric stranding or nephrolithiasis. No obstructing ureteral stones. --Urinary bladder: Normal appearance for the degree of distention. Stomach/Bowel: --Stomach/Duodenum: No hiatal hernia or other gastric abnormality. Normal duodenal course. --Small bowel: No dilatation or inflammation. --Colon: There is long segment wall thickening and surrounding mild inflammatory change extending from the cecum to the mid descending colon. There is no free intraperitoneal air. No abscess. There is a large  amount of fluid in the pelvis. --Appendix: Normal. Vascular/Lymphatic: Atherosclerotic calcification is present within the non-aneurysmal abdominal aorta, without hemodynamically significant stenosis. The portal vein, superior mesenteric vein and IVC are patent. No abdominal or pelvic lymphadenopathy. Reproductive: Status post hysterectomy. No adnexal mass. Musculoskeletal. No bony spinal canal stenosis or focal osseous abnormality. Other: None. IMPRESSION: 1. Long segment colitis that extends from the cecum to the mid descending colon. The findings are worst in the proximal colon. 2. No free intraperitoneal air or abscess formation. There is a large amount of fluid in the pelvis. 3.  Aortic Atherosclerosis (ICD10-I70.0). 4. Hepatic steatosis. 5. Unchanged appearance of indeterminate right adrenal lesion. 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 This could be better characterized with a nonemergent abdominal MRI with and without contrast or a multiphase adrenal mass protocol CT. Electronically Signed   By: Deatra Robinson M.D.   On: 06/14/2017 23:27        Scheduled Meds: . feeding supplement (ENSURE ENLIVE)  237 mL Oral BID BM  . folic acid  1 mg Oral Daily  . heparin  5,000 Units Subcutaneous Q8H  . lipase/protease/amylase  72,000 Units Oral TID AC  . lisinopril  20 mg Oral Daily  . LORazepam  0-4 mg Oral Q6H   Followed by  . [START ON 06/16/2017] LORazepam  0-4 mg Oral Q12H  . multivitamin with minerals  1 tablet Oral Daily  . pantoprazole  40 mg Oral Daily  . thiamine  100 mg Oral Daily   Or  . thiamine  100 mg Intravenous Daily   Continuous Infusions: . sodium chloride       LOS: 1 day    Time spent:    Erick Blinks, MD [facility] Pager [phone]  If 7PM-7AM, please contact night-coverage [email or web address] Password [facility] 06/15/2017, 6:51 PM
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
## 2017-06-16 ENCOUNTER — Encounter (HOSPITAL_COMMUNITY)
Admission: EM | Disposition: A | Discharge status: Home / Self Care | Payer: Self-pay | Source: Home / Self Care | Attending: Internal Medicine

## 2017-06-16 ENCOUNTER — Encounter (HOSPITAL_COMMUNITY): Payer: Self-pay | Admitting: Anesthesiology

## 2017-06-16 ENCOUNTER — Inpatient Hospital Stay (HOSPITAL_COMMUNITY): Payer: Self-pay | Admitting: Anesthesiology

## 2017-06-16 DIAGNOSIS — K2970 Gastritis, unspecified, without bleeding: Secondary | ICD-10-CM

## 2017-06-16 DIAGNOSIS — G43A0 Cyclical vomiting, not intractable: Secondary | ICD-10-CM

## 2017-06-16 DIAGNOSIS — R1115 Cyclical vomiting syndrome unrelated to migraine: Secondary | ICD-10-CM

## 2017-06-16 DIAGNOSIS — R1310 Dysphagia, unspecified: Secondary | ICD-10-CM

## 2017-06-16 DIAGNOSIS — R1013 Epigastric pain: Secondary | ICD-10-CM

## 2017-06-16 DIAGNOSIS — R1084 Generalized abdominal pain: Secondary | ICD-10-CM

## 2017-06-16 HISTORY — PX: ESOPHAGOGASTRODUODENOSCOPY: SHX5428

## 2017-06-16 HISTORY — PX: BIOPSY: SHX5522

## 2017-06-16 HISTORY — PX: SAVORY DILATION: SHX5439

## 2017-06-16 LAB — CBC
HCT: 28.1 % — ABNORMAL LOW (ref 36.0–46.0)
Hemoglobin: 9 g/dL — ABNORMAL LOW (ref 12.0–15.0)
MCH: 33.1 pg (ref 26.0–34.0)
MCHC: 32 g/dL (ref 30.0–36.0)
MCV: 103.3 fL — AB (ref 78.0–100.0)
PLATELETS: 186 10*3/uL (ref 150–400)
RBC: 2.72 MIL/uL — AB (ref 3.87–5.11)
RDW: 16.3 % — ABNORMAL HIGH (ref 11.5–15.5)
WBC: 7.2 10*3/uL (ref 4.0–10.5)

## 2017-06-16 LAB — COMPREHENSIVE METABOLIC PANEL
ALBUMIN: 1.7 g/dL — AB (ref 3.5–5.0)
ALT: 55 U/L — AB (ref 14–54)
AST: 97 U/L — ABNORMAL HIGH (ref 15–41)
Alkaline Phosphatase: 216 U/L — ABNORMAL HIGH (ref 38–126)
Anion gap: 9 (ref 5–15)
BUN: <5 mg/dL — ABNORMAL LOW (ref 6–20)
CHLORIDE: 100 mmol/L — AB (ref 101–111)
CO2: 25 mmol/L (ref 22–32)
CREATININE: 0.5 mg/dL (ref 0.44–1.00)
Calcium: 7.5 mg/dL — ABNORMAL LOW (ref 8.9–10.3)
GFR calc non Af Amer: >60 mL/min (ref >60)
GLUCOSE: 118 mg/dL — AB (ref 65–99)
Potassium: 4.8 mmol/L (ref 3.5–5.1)
SODIUM: 134 mmol/L — AB (ref 135–145)
Total Bilirubin: 2.6 mg/dL — ABNORMAL HIGH (ref 0.3–1.2)
Total Protein: 4.6 g/dL — ABNORMAL LOW (ref 6.5–8.1)

## 2017-06-16 LAB — VITAMIN D 25 HYDROXY (VIT D DEFICIENCY, FRACTURES): Vit D, 25-Hydroxy: 5.4 ng/mL — ABNORMAL LOW (ref 30.0–100.0)

## 2017-06-16 LAB — MAGNESIUM: Magnesium: 1.8 mg/dL (ref 1.7–2.4)

## 2017-06-16 LAB — HIV ANTIBODY (ROUTINE TESTING W REFLEX): HIV Screen 4th Generation wRfx: NONREACTIVE

## 2017-06-16 SURGERY — EGD (ESOPHAGOGASTRODUODENOSCOPY)
Anesthesia: Monitor Anesthesia Care

## 2017-06-16 MED ORDER — PROPOFOL 500 MG/50ML IV EMUL
INTRAVENOUS | Status: DC | PRN
  Administered 2017-06-16: 125 ug/kg/min via INTRAVENOUS
  Administered 2017-06-16: 13:00:00 via INTRAVENOUS

## 2017-06-16 MED ORDER — HYDROMORPHONE HCL 1 MG/ML IJ SOLN
INTRAMUSCULAR | Status: AC
  Filled 2017-06-16: qty 0.5

## 2017-06-16 MED ORDER — LACTATED RINGERS IV SOLN
INTRAVENOUS | Status: DC
  Administered 2017-06-16: 10:00:00 via INTRAVENOUS

## 2017-06-16 MED ORDER — HYDROMORPHONE HCL 1 MG/ML IJ SOLN: 0.2500 mg | INTRAMUSCULAR | Status: DC | PRN

## 2017-06-16 MED ORDER — PANTOPRAZOLE SODIUM 40 MG PO TBEC
40.0000 mg | DELAYED_RELEASE_TABLET | Freq: Every day | ORAL | Status: DC
  Administered 2017-06-17: 40 mg via ORAL
  Filled 2017-06-16: qty 1

## 2017-06-16 MED ORDER — MIDAZOLAM HCL 2 MG/2ML IJ SOLN
INTRAMUSCULAR | Status: AC
  Filled 2017-06-16: qty 2

## 2017-06-16 MED ORDER — MIDAZOLAM HCL 5 MG/5ML IJ SOLN
INTRAMUSCULAR | Status: DC | PRN
  Administered 2017-06-16: 2 mg via INTRAVENOUS

## 2017-06-16 MED ORDER — LIDOCAINE VISCOUS 2 % MT SOLN
15.0000 mL | Freq: Once | OROMUCOSAL | Status: AC
Start: 2017-06-16 — End: 2017-06-16
  Administered 2017-06-16: 15 mL via OROMUCOSAL

## 2017-06-16 MED ORDER — PANCRELIPASE (LIP-PROT-AMYL) 36000-114000 UNITS PO CPEP
144000.0000 [IU] | ORAL_CAPSULE | Freq: Three times a day (TID) | ORAL | Status: DC
  Administered 2017-06-16 – 2017-06-17 (×2): 144000 [IU] via ORAL
  Filled 2017-06-16 (×3): qty 4

## 2017-06-16 MED ORDER — HYDROMORPHONE HCL 1 MG/ML IJ SOLN
0.5000 mg | Freq: Once | INTRAMUSCULAR | Status: AC
  Administered 2017-06-16: 0.5 mg via INTRAVENOUS

## 2017-06-16 MED ORDER — MIDAZOLAM HCL 2 MG/2ML IJ SOLN
1.0000 mg | Freq: Once | INTRAMUSCULAR | Status: AC | PRN
  Administered 2017-06-16: 2 mg via INTRAVENOUS

## 2017-06-16 MED ORDER — PROPOFOL 10 MG/ML IV BOLUS
INTRAVENOUS | Status: AC
  Filled 2017-06-16: qty 20

## 2017-06-16 MED ORDER — SODIUM CHLORIDE 0.9 % IV SOLN: INTRAVENOUS | Status: DC

## 2017-06-16 MED ORDER — LIDOCAINE VISCOUS 2 % MT SOLN
OROMUCOSAL | Status: AC
  Filled 2017-06-16: qty 15

## 2017-06-16 MED ORDER — ONDANSETRON HCL 4 MG/2ML IJ SOLN
4.0000 mg | Freq: Three times a day (TID) | INTRAMUSCULAR | Status: DC
  Administered 2017-06-16 – 2017-06-17 (×4): 4 mg via INTRAVENOUS
  Filled 2017-06-16 (×4): qty 2

## 2017-06-16 MED ORDER — MINERAL OIL PO OIL
TOPICAL_OIL | ORAL | Status: AC
  Filled 2017-06-16: qty 30

## 2017-06-16 NOTE — Progress Notes (Signed)
States she dont feel anything in her stomach now.

## 2017-06-16 NOTE — Progress Notes (Signed)
Briefly spoke with patient this morning regarding plans for EGD. She is NPO. Last dose of Heparin yesterday evening. DVT prophylaxis with Heparin on hold until EGD completed. She notes diarrhea but nursing states she is having soft stool.  Will need a colonoscopy in future but pursuing EGD initially. Hgb improved to 9 after 1 unit. Aware of risks and benefits and desires to proceed.

## 2017-06-16 NOTE — Interval H&P Note (Signed)
History and Physical Interval Note:  06/16/2017 10:09 AM  Kendra Cox  has presented today for surgery, with the diagnosis of DYSPEPSIA  The various methods of treatment have been discussed with the patient and family. After consideration of risks, benefits and other options for treatment, the patient has consented to  Procedure(s): ESOPHAGOGASTRODUODENOSCOPY (EGD) (N/A) as a surgical intervention .  The patient's history has been reviewed, patient examined, no change in status, stable for surgery.  I have reviewed the patient's chart and labs.  Questions were answered to the patient's satisfaction.     Eaton CorporationSandi Yaneli Keithley

## 2017-06-16 NOTE — Anesthesia Preprocedure Evaluation (Addendum)
Anesthesia Evaluation  Patient identified by MRN, date of birth, ID band Patient awake    Airway   TM Distance: >3 FB Neck ROM: Full    Dental  (+) Poor Dentition   Pulmonary asthma (stable) , Current Smoker,    Pulmonary exam normal        Cardiovascular Exercise Tolerance: Poor hypertension, Pt. on medications Normal cardiovascular exam Rhythm:Regular Rate:Normal     Neuro/Psych Anxiety Depression    GI/Hepatic Results for Kendra Cox, Kendra Cox (MRN [id number]) as of 06/16/2017 10:01  06/16/2017 04:33 Alkaline Phosphatase: 216 (H) Albumin: 1.7 (L) AST: 97 (H) ALT: 55 (H)    Endo/Other    Renal/GU Results for Kendra Cox, Kendra Cox (MRN [id number]) as of 06/16/2017 10:01  06/16/2017 04:33 Sodium: 134 (L) Potassium: 4.8 Chloride: 100 (L) CO2: 25 Glucose: 118 (H) BUN: <5 (L) Creatinine: 0.50      Musculoskeletal  (+) Fibromyalgia -  Abdominal   Peds  Hematology  (+) anemia , Results for Kendra Cox, Kendra Cox (MRN [id number]) as of 06/16/2017 10:01  06/16/2017 04:33 Hemoglobin: 9.0 (L) HCT: 28.1 (L) MCV: 103.3 (H) MCH: 33.1 MCHC: 32.0 RDW: 16.3 (H) Platelets: 186    Anesthesia Other Findings   Reproductive/Obstetrics                            Anesthesia Physical Anesthesia Plan  ASA: III  Anesthesia Plan: MAC   Post-op Pain Management:    Induction:   PONV Risk Score and Plan: Ondansetron and Midazolam  Airway Management Planned:   Additional Equipment:   Intra-op Plan:   Post-operative Plan:   Informed Consent: I have reviewed the patients History and Physical, chart, labs and discussed the procedure including the risks, benefits and alternatives for the proposed anesthesia with the patient or authorized representative who has indicated his/her understanding and acceptance.   Dental advisory given  Plan Discussed with: CRNA  Anesthesia Plan Comments:         Anesthesia Quick  Evaluation

## 2017-06-16 NOTE — Transfer of Care (Signed)
Immediate Anesthesia Transfer of Care Note  Patient: Kendra Cox  Procedure(s) Performed: ESOPHAGOGASTRODUODENOSCOPY (EGD) (N/A ) BIOPSY SAVORY DILATION  Patient Location: PACU  Anesthesia Type:MAC  Level of Consciousness: awake and patient cooperative  Airway & Oxygen Therapy: Patient Spontanous Breathing and Patient connected to face mask oxygen  Post-op Assessment: Report given to RN, Post -op Vital signs reviewed and stable and Patient moving all extremities  Post vital signs: Reviewed and stable  Last Vitals:  Vitals:   06/16/17 1255 06/16/17 1300  BP: (!) 163/110 (!) 155/117  Pulse:    Resp: 18 (!) 33  Temp:    SpO2: 98% 95%    Last Pain:  Vitals:   06/16/17 1313  TempSrc:   PainSc: 7       Patients Stated Pain Goal: (P) 7 (12/87/86 7672)  Complications: No apparent anesthesia complications

## 2017-06-16 NOTE — Anesthesia Postprocedure Evaluation (Signed)
Anesthesia Post Note  Patient: FAYNE MCGUFFEE  Procedure(s) Performed: ESOPHAGOGASTRODUODENOSCOPY (EGD) (N/A ) BIOPSY SAVORY DILATION  Patient location during evaluation: PACU Anesthesia Type: MAC Level of consciousness: awake and patient cooperative Pain management: pain level controlled Vital Signs Assessment: post-procedure vital signs reviewed and stable Respiratory status: spontaneous breathing, nonlabored ventilation and respiratory function stable Cardiovascular status: blood pressure returned to baseline Postop Assessment: no apparent nausea or vomiting Anesthetic complications: no     Last Vitals:  Vitals:   06/16/17 1348 06/16/17 1400  BP: (!) 145/105 (!) 125/93  Pulse: (!) 111 (!) 101  Resp: 11 18  Temp: 36.7 C   SpO2: 100% 100%    Last Pain:  Vitals:   06/16/17 1348  TempSrc:   PainSc: 7                  Makenlee Mckeag J

## 2017-06-16 NOTE — Op Note (Signed)
[facility] Patient Name: Kendra Cox Procedure Date: 06/16/2017 12:51 PM MRN: [id number] Date of Birth: [birth year] Attending MD: Jonette Eva MD, MD CSN: [id number] Age: 52 Admit Type: Inpatient Procedure:                Upper GI endoscopy WITH COLD FORCEPS                            BIOPSY/ESOPHAGEAL DILATION Indications:              Generalized abdominal pain, Dyspepsia,                            Diarrhea,PMHx: PANCREATIC INSUFFICIENCY BUT COULDNT                            AFFORD THE CREON. Providers:                Jonette Eva MD, MD, Jannett Celestine, RN, Edythe Clarity,                            Technician Referring MD:             Lytle Michaels. Kim Medicines:                Propofol per Anesthesia Complications:            No immediate complications. Estimated Blood Loss:     Estimated blood loss was minimal. Procedure:                Pre-Anesthesia Assessment:                           - Prior to the procedure, a History and Physical                            was performed, and patient medications and                            allergies were reviewed. The patient's tolerance of                            previous anesthesia was also reviewed. The risks                            and benefits of the procedure and the sedation                            options and risks were discussed with the patient.                            All questions were answered, and informed consent                            was obtained. Prior Anticoagulants: The patient has                            taken no previous  anticoagulant or antiplatelet                            agents. ASA Grade Assessment: II - A patient with                            mild systemic disease. After reviewing the risks                            and benefits, the patient was deemed in                            satisfactory condition to undergo the procedure.                            After obtaining informed consent,  the endoscope was                            passed under direct vision. Throughout the                            procedure, the patient's blood pressure, pulse, and                            oxygen saturations were monitored continuously. The                            EG-[id number] ([id number] scope was introduced through the                            mouth, and advanced to the second part of duodenum.                            The upper GI endoscopy was accomplished without                            difficulty. The patient tolerated the procedure                            well. Scope In: 1:25:26 PM Scope Out: 1:37:14 PM Total Procedure Duration: 0 hours 11 minutes 48 seconds  Findings:      No endoscopic abnormality was evident in the esophagus to explain the       patient's complaint of dysphagia. A guidewire was placed and the scope       was withdrawn. Dilation was performed with a Savary dilator with mild       resistance at 16 mm and 17 mm DUE TO POSSIBLE PROXIMAL ESOPHAGEAL WEB.       Estimated blood loss was minimal.      Diffuse mild inflammation characterized by congestion (edema) and       erythema was found in the entire examined stomach. Biopsies were taken       with a cold forceps for Helicobacter pylori testing(2/3).      The examined duodenum was normal. Biopsies for histology were taken with  a cold forceps for evaluation of celiac disease(2/4). Impression:               - NAUSEA AND VOMTIING MOST LIKEY DUE TO                            GERD/GASTRITIS , LESS SLIKELY CELIAC SPRUE OR SIBO                           - MILD Gastritis.                           - DIARRHEA MOST LIKLEY DUE TO PANCREATIC                            INSUFFICIENCY,LESS LIKELY CELIAC SPRUE OF SIBO Moderate Sedation:      Per Anesthesia Care Recommendation:           - Full liquid diet.                           - Continue present medications. ZOFRAN ATC. CHECK                            AM  CORTISOL, AND HB A1C. PROTONIX DAILY. CREON                            144,000 UNITS WITH MEALS                           - Await pathology results. CHECK GGT.                           - Return to GI office([facility]) in 6 weeks.                           - Return patient to hospital ward for ongoing care. Procedure Code(s):        --- Professional ---                           [phone], Esophagogastroduodenoscopy, flexible,                            transoral; with insertion of guide wire followed by                            passage of dilator(s) through esophagus over guide                            wire                           [id number], Esophagogastroduodenoscopy, flexible,                            transoral; with biopsy, single or multiple Diagnosis Code(s):        --- Professional ---  R13.10, Dysphagia, unspecified                           K29.70, Gastritis, unspecified, without bleeding                           R10.84, Generalized abdominal pain                           R10.13, Epigastric pain                           R19.7, Diarrhea, unspecified CPT copyright 2016 [employer]. All rights reserved. The codes documented in this report are preliminary and upon coder review may  be revised to meet current compliance requirements. Jonette Eva, MD Jonette Eva MD, MD 06/16/2017 1:58:29 PM This report has been signed electronically. Number of Addenda: 0

## 2017-06-16 NOTE — Progress Notes (Signed)
States back pain too

## 2017-06-16 NOTE — Progress Notes (Signed)
PROGRESS NOTE    Kendra Cox  ZOX:[id number] DOB: 1966/05/01 DOA: 06/14/2017 PCP: Pearson Grippe, MD    Brief Narrative:  52 year old female with a history of alcohol abuse, presents to the hospital with leg swelling.  Found to be severely hypokalemic, hypomagnesemic and hypoalbuminemic.  She is also noted to have a significant anemia with hemoglobin down to 7.5.  She was admitted to the hospital for further workup.  She complained of unintentional weight loss with nausea and vomiting.  Seen by GI and underwent endoscopy with results as below.  Advancing diet.  If she tolerates this, anticipate discharge in the next 24 hours   Assessment & Plan:   Principal Problem:   Hypokalemia Active Problems:   Malnutrition (HCC)   Pancreatic insufficiency   Hypoalbuminemia   IDA (iron deficiency anemia)   Depression   PTSD (post-traumatic stress disorder)   Generalized weakness   Abnormal weight loss   Asthma   Hypertension   Fibromyalgia   Sinus tachycardia   Current every day smoker   Chronic alcohol abuse   Hypomagnesemia   Hyperbilirubinemia   Prolonged Q-T interval on ECG   Anemia   Chronic abdominal pain   Cyclical vomiting with nausea   1. Hypokalemia.  Related to poor p.o. intake and hydrochlorothiazide.  Replaced. 2. Hypomagnesemia.  Replaced. 3. Chronic abdominal pain with nausea and vomiting.  Seen by gastroenterology.  Patient underwent endoscopy today that indicated GERD/gastritis, which was felt to be the cause of her symptoms.  Advance diet as tolerated. 4. Abnormal weight loss.  Likely related to poor p.o. intake.  No obvious malignancy on CT abdomen or chest x-ray.  GI following 5. 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  This could also be contributing to weight loss.  May need case management help in securing medications 6. Anemia.  Anemia panel indicates folate deficiency.  B12 level is elevated.  Iron is in normal range.  Start the patient on folate  replacement.  Suspect this is related to alcohol intake.  Hemoglobin was 7.5.  She received 1 unit of PRBC in order to optimize her hemoglobin..  Follow-up hemoglobin is improved at 9.0 7. Generalized weakness.  Seen by physical therapy no further therapy was recommended. 8. Hypertension.  Resume home medications.  Blood pressure currently stable. 9. Hypoalbuminemia.  Dietary consult. 10. Chronic alcohol abuse.  On CIWA protocol with Ativan.  Patient does not acknowledge that she has any issues with alcohol abuse.  No signs of active withdrawal at this time. 11. Lower extremity edema.  Suspect this is related to hypoalbuminemia   DVT prophylaxis: Heparin Code Status: Full code Family Communication: No family present Disposition Plan: Discharge home once improved   Consultants:   Gastroenterology  Procedures:  EGD:- NAUSEA AND VOMTIING MOST LIKEY DUE TO                            GERD/GASTRITIS , LESS SLIKELY CELIAC SPRUE OR SIBO                           - MILD Gastritis.                           - DIARRHEA MOST LIKLEY DUE TO PANCREATIC  INSUFFICIENCY,LESS LIKELY CELIAC SPRUE OF SIBO     Antimicrobials:      Subjective: Feeling better today.  Still has some nausea, no vomiting.  She reports having diarrhea this morning, but staff reports it as formed stool  Objective: Vitals:   06/16/17 1429 06/16/17 1641 06/16/17 1749 06/16/17 1759  BP: (!) 149/90 (!) 131/94 (!) 136/95 (!) 132/95  Pulse: (!) 101 (!) 101 (!) 112   Resp: 16 16  20   Temp: 98.3 F (36.8 C) 99 F (37.2 C)  98.4 F (36.9 C)  TempSrc:    Oral  SpO2: 97% 99%  98%  Weight:      Height:        Intake/Output Summary (Last 24 hours) at 06/16/2017 1851 Last data filed at 06/16/2017 1801 Gross per 24 hour  Intake 1244 ml  Output 500 ml  Net 744 ml   Filed Weights   06/14/17 1444 06/15/17 0132 06/16/17 0515  Weight: 52.6 kg (116 lb) 55.9 kg (123 lb 3.8 oz) 56.1 kg (123 lb 9.6  oz)    Examination:  General exam: Appears calm and comfortable  Respiratory system: Clear to auscultation. Respiratory effort normal. Cardiovascular system: S1 & S2 heard, tachycardic. No JVD, murmurs, rubs, gallops or clicks. 1+ pedal edema. Gastrointestinal system: Abdomen is nondistended, soft and nontender. No organomegaly or masses felt. Normal bowel sounds heard. Central nervous system: No focal neurological deficits. Extremities: Symmetric 5 x 5 power. Skin: No rashes, lesions or ulcers Psychiatry: Awake and alert, appropriate, engaged in conversation.    Data Reviewed: I have personally reviewed following labs and imaging studies  CBC: Recent Labs  Lab 06/14/17 1727 06/15/17 0522 06/16/17 0433  WBC 7.2 6.8 7.2  NEUTROABS 4.4  --   --   HGB 8.7* 7.5* 9.0*  HCT 27.8* 23.2* 28.1*  MCV 110.8* 106.4* 103.3*  PLT 228 208 186   Basic Metabolic Panel: Recent Labs  Lab 06/14/17 1727 06/15/17 0522 06/16/17 0433  NA 136 134* 134*  K 2.5* 3.4* 4.8  CL 96* 99* 100*  CO2 25 22 25   GLUCOSE 104* 127* 118*  BUN <5* <5* <5*  CREATININE 0.51 0.40* 0.50  CALCIUM 7.7* 7.1* 7.5*  MG 1.4* 1.6* 1.8   GFR: Estimated Creatinine Clearance: 72.9 mL/min (by C-G formula based on SCr of 0.5 mg/dL). Liver Function Tests: Recent Labs  Lab 06/14/17 1727 06/15/17 0522 06/16/17 0433  AST 77* 100* 97*  ALT 55* 52 55*  ALKPHOS 229* 224* 216*  BILITOT 1.6* 1.9* 2.6*  PROT 4.8* 4.4* 4.6*  ALBUMIN 1.7* 1.6* 1.7*   Recent Labs  Lab 06/14/17 1727  LIPASE 15   Recent Labs  Lab 06/15/17 1626  AMMONIA 32   Coagulation Profile: Recent Labs  Lab 06/15/17 0522  INR 1.26   Cardiac Enzymes: Recent Labs  Lab 06/14/17 1727  TROPONINI <0.03   BNP (last 3 results) No results for input(s): PROBNP in the last 8760 hours. HbA1C: No results for input(s): HGBA1C in the last 72 hours. CBG: No results for input(s): GLUCAP in the last 168 hours. Lipid Profile: Recent Labs     06/15/17 0522  CHOL 155  HDL <10*  LDLCALC NOT CALCULATED  TRIG 241*  CHOLHDL NOT CALCULATED   Thyroid Function Tests: Recent Labs    06/14/17 1930  TSH 2.469   Anemia Panel: Recent Labs    06/14/17 1930  VITAMINB12 1,866*  FOLATE 3.0*  FERRITIN 386*  TIBC NOT CALCULATED  IRON 65  RETICCTPCT  5.6*   Sepsis Labs: No results for input(s): PROCALCITON, LATICACIDVEN in the last 168 hours.  No results found for this or any previous visit (from the past 240 hour(s)).       Radiology Studies: Ct Abdomen Pelvis W Contrast  Result Date: 06/14/2017 CLINICAL DATA:  Abdominal distention, nausea and vomiting EXAM: CT ABDOMEN AND PELVIS WITH CONTRAST TECHNIQUE: Multidetector CT imaging of the abdomen and pelvis was performed using the standard protocol following bolus administration of intravenous contrast. CONTRAST:  100mL ISOVUE-300 IOPAMIDOL (ISOVUE-300) INJECTION 61% COMPARISON:  CT abdomen pelvis 12/26/2015 FINDINGS: Lower chest: No basilar pulmonary nodules or pleural effusion. No apical pericardial effusion. Hepatobiliary: Diffuse hepatic steatosis. No focal liver lesion. There is mild fatty sparing at the gallbladder fossa. Normal gallbladder. Pancreas: Normal parenchymal contours without ductal dilatation. No peripancreatic fluid collection. Spleen: The spleen is partially atrophic. There are multiple coils along the course of the splenic artery. Adrenals/Urinary Tract: --Adrenal glands: Unchanged appearance of right adrenal lesion measuring 1.5 x 0.7 cm with an intermediate attenuation value. Normal left adrenal gland. --Right kidney/ureter: No hydronephrosis, perinephric stranding or nephrolithiasis. No obstructing ureteral stones. --Left kidney/ureter: No hydronephrosis, perinephric stranding or nephrolithiasis. No obstructing ureteral stones. --Urinary bladder: Normal appearance for the degree of distention. Stomach/Bowel: --Stomach/Duodenum: No hiatal hernia or other gastric  abnormality. Normal duodenal course. --Small bowel: No dilatation or inflammation. --Colon: There is long segment wall thickening and surrounding mild inflammatory change extending from the cecum to the mid descending colon. There is no free intraperitoneal air. No abscess. There is a large amount of fluid in the pelvis. --Appendix: Normal. Vascular/Lymphatic: Atherosclerotic calcification is present within the non-aneurysmal abdominal aorta, without hemodynamically significant stenosis. The portal vein, superior mesenteric vein and IVC are patent. No abdominal or pelvic lymphadenopathy. Reproductive: Status post hysterectomy. No adnexal mass. Musculoskeletal. No bony spinal canal stenosis or focal osseous abnormality. Other: None. IMPRESSION: 1. Long segment colitis that extends from the cecum to the mid descending colon. The findings are worst in the proximal colon. 2. No free intraperitoneal air or abscess formation. There is a large amount of fluid in the pelvis. 3.  Aortic Atherosclerosis (ICD10-I70.0). 4. Hepatic steatosis. 5. Unchanged appearance of indeterminate right adrenal lesion. 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 This could be better characterized with a nonemergent abdominal MRI with and without contrast or a multiphase adrenal mass protocol CT. Electronically Signed   By: Deatra RobinsonKevin  Herman M.D.   On: 06/14/2017 23:27        Scheduled Meds: . feeding supplement (ENSURE ENLIVE)  237 mL Oral BID BM  . folic acid  1 mg Oral Daily  . lipase/protease/amylase  144,000 Units Oral TID WC  . lisinopril  20 mg Oral Daily  . LORazepam  0-4 mg Oral Q6H   Followed by  . LORazepam  0-4 mg Oral Q12H  . multivitamin with minerals  1 tablet Oral Daily  . ondansetron (ZOFRAN) IV  4 mg Intravenous TID WC & HS  . [START ON 06/17/2017] pantoprazole  40 mg Oral Daily  . thiamine  100 mg Oral Daily   Or  . thiamine  100 mg Intravenous Daily   Continuous Infusions:    LOS: 2 days    Time spent:  25mins    Erick BlinksJehanzeb Memon, MD [facility] Pager [phone]  If 7PM-7AM, please contact night-coverage [email or web address] Password [email or web address] 06/16/2017, 6:51 PM

## 2017-06-17 DIAGNOSIS — F1010 Alcohol abuse, uncomplicated: Secondary | ICD-10-CM

## 2017-06-17 DIAGNOSIS — E8809 Other disorders of plasma-protein metabolism, not elsewhere classified: Secondary | ICD-10-CM

## 2017-06-17 DIAGNOSIS — D509 Iron deficiency anemia, unspecified: Secondary | ICD-10-CM

## 2017-06-17 DIAGNOSIS — E46 Unspecified protein-calorie malnutrition: Secondary | ICD-10-CM

## 2017-06-17 DIAGNOSIS — K8689 Other specified diseases of pancreas: Secondary | ICD-10-CM

## 2017-06-17 DIAGNOSIS — R531 Weakness: Secondary | ICD-10-CM

## 2017-06-17 DIAGNOSIS — M797 Fibromyalgia: Secondary | ICD-10-CM

## 2017-06-17 LAB — COMPREHENSIVE METABOLIC PANEL
ALBUMIN: 1.6 g/dL — AB (ref 3.5–5.0)
ALK PHOS: 204 U/L — AB (ref 38–126)
ALT: 53 U/L (ref 14–54)
AST: 83 U/L — AB (ref 15–41)
Anion gap: 9 (ref 5–15)
BILIRUBIN TOTAL: 2.3 mg/dL — AB (ref 0.3–1.2)
CALCIUM: 7.8 mg/dL — AB (ref 8.9–10.3)
CO2: 25 mmol/L (ref 22–32)
Chloride: 101 mmol/L (ref 101–111)
Creatinine, Ser: 0.57 mg/dL (ref 0.44–1.00)
GFR calc Af Amer: >60 mL/min (ref >60)
GFR calc non Af Amer: >60 mL/min (ref >60)
GLUCOSE: 102 mg/dL — AB (ref 65–99)
Potassium: 4 mmol/L (ref 3.5–5.1)
SODIUM: 135 mmol/L (ref 135–145)
TOTAL PROTEIN: 4.4 g/dL — AB (ref 6.5–8.1)

## 2017-06-17 LAB — CBC
HEMATOCRIT: 29.8 % — AB (ref 36.0–46.0)
HEMOGLOBIN: 9.3 g/dL — AB (ref 12.0–15.0)
MCH: 33.2 pg (ref 26.0–34.0)
MCHC: 31.2 g/dL (ref 30.0–36.0)
MCV: 106.4 fL — ABNORMAL HIGH (ref 78.0–100.0)
Platelets: 201 10*3/uL (ref 150–400)
RBC: 2.8 MIL/uL — ABNORMAL LOW (ref 3.87–5.11)
RDW: 19.6 % — AB (ref 11.5–15.5)
WBC: 7 10*3/uL (ref 4.0–10.5)

## 2017-06-17 LAB — BPAM RBC
Blood Product Expiration Date: 201902162359
ISSUE DATE / TIME: 201901290016
Unit Type and Rh: 6200

## 2017-06-17 LAB — TYPE AND SCREEN
ABO/RH(D): A POS
Antibody Screen: NEGATIVE
Unit division: 0

## 2017-06-17 LAB — MAGNESIUM: Magnesium: 1.7 mg/dL (ref 1.7–2.4)

## 2017-06-17 LAB — HEMOGLOBIN A1C
Hgb A1c MFr Bld: 4.7 % — ABNORMAL LOW (ref 4.8–5.6)
Mean Plasma Glucose: 88.19 mg/dL

## 2017-06-17 LAB — GAMMA GT: GGT: 294 U/L — ABNORMAL HIGH (ref 7–50)

## 2017-06-17 LAB — CORTISOL-AM, BLOOD: CORTISOL - AM: 6.4 ug/dL — AB (ref 6.7–22.6)

## 2017-06-17 MED ORDER — LISINOPRIL 20 MG PO TABS: 20.0000 mg | ORAL_TABLET | Freq: Every day | ORAL | 0 refills | Status: DC

## 2017-06-17 MED ORDER — PANCRELIPASE (LIP-PROT-AMYL) 36000-114000 UNITS PO CPEP: 144000.0000 [IU] | ORAL_CAPSULE | Freq: Three times a day (TID) | ORAL | 0 refills | Status: DC

## 2017-06-17 MED ORDER — PANTOPRAZOLE SODIUM 40 MG PO TBEC: 40.0000 mg | DELAYED_RELEASE_TABLET | Freq: Every day | ORAL | 0 refills | Status: AC

## 2017-06-17 MED ORDER — FOLIC ACID 1 MG PO TABS: 1.0000 mg | ORAL_TABLET | Freq: Every day | ORAL | 0 refills | Status: DC

## 2017-06-17 NOTE — Discharge Summary (Signed)
Physician Discharge Summary  Kendra Cox:[id number] DOB: March 04, 1966 DOA: 06/14/2017  PCP: Pearson Grippe, MD  Admit date: 06/14/2017 Discharge date: 06/17/2017  Admitted From: Home Disposition:  Home   Recommendations for Outpatient Follow-up:  1. Follow up with PCP in 1-2 weeks 2. Please obtain BMP/CBC in one week    Discharge Condition: Stable CODE STATUS: FULL Diet recommendation: soft   Brief/Interim Summary: 52 year old female with a history of alcohol abuse, presents to the hospital with leg swelling.  Found to be severely hypokalemic, hypomagnesemic and hypoalbuminemic.  She is also noted to have a significant anemia with hemoglobin down to 7.5.  She was admitted to the hospital for further workup.  She complained of unintentional weight loss with nausea and vomiting.  Seen by GI and underwent endoscopy with results as below.    06/16/2017 EGD showed diffuse mild inflammation and edema and erythema of the entire stomach.  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  GI recommended continuing Creon 144K units with each meal.  With assistance of case management, medication assistance for creon was arranged.  I filled out medical necessity form prior to d/c.  The patient's diet was advanced to soft diet which she tolerated without any vomiting or abdominal pain.    Discharge Diagnoses:  1. Hypokalemia.  Related to poor p.o. intake and hydrochlorothiazide.  Replaced. K = 4.0 on day of d/c 2. Hypomagnesemia.  Replaced.  Mag 1.7 on day of d/c Chronic abdominal pain with nausea and vomiting.  Seen by gastroenterology.  Patient underwent endoscopy 06/16/17 that indicated GERD/gastritis, which was felt to be the cause of her symptoms.  Advanced diet which she tolerated.  The patient was not felt to need abx for her colitis by GI as pt was not having diarrhea and abd pain had completely resolved and she was tolerating her diet. Needs colonoscopy/EGD but would not be  able to tolerate colonoscopy prep currently per GI.  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  The [facility] Controlled Substance Reporting System has been queried for this patient--she most recently received Percocet 5/325 120 and Xanax No. 90 from her primary care provider on 05/27/2017. 3. Abnormal weight loss.  Likely related to poor p.o. intake.  No obvious malignancy on CT abdomen or chest x-ray.  GI following 4. 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  This could also be contributing to weight loss.  Case management helped arrange for medication assistance 5. Anemia.  Anemia panel indicates folate deficiency.  B12 level is elevated.  Iron is in normal range.  Start the patient on folate replacement.  Suspect this is related to alcohol intake.  Hemoglobin was 7.5.  She received 1 unit of PRBC in order to optimize her hemoglobin..  Follow-up hemoglobin is improved at 9.3 on day of d/c.  Remained stable after transfusion 6. Generalized weakness.  Seen by physical therapy no further therapy was recommended. 7. Hypertension.  Resume home medications.  Blood pressure currently stable. 8. Hypoalbuminemia.  Dietary consult. 9. Chronic alcohol abuse.  On CIWA protocol with Ativan.  Patient does not acknowledge that she has any issues with alcohol abuse.  No signs of active withdrawal at this time.  Other notes in medical record have stated that the patient drinks to the point of inebriation 10. Lower extremity edema.  Suspect this is related to hypoalbuminemia 11. 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 Hep B surface  antibody, Hep B core antibody, Hep C antibody, and Hep A antibody all negative in Sept 2018.       Discharge Instructions  Discharge Instructions    Diet - low sodium heart healthy   Complete by:  As directed    Increase activity slowly   Complete by:  As directed      Allergies as of  06/17/2017      Reactions   Sulfa Antibiotics Anaphylaxis      Medication List    STOP taking these medications   lisinopril-hydrochlorothiazide 20-12.5 MG tablet Commonly known as:  PRINZIDE,ZESTORETIC     TAKE these medications   albuterol 108 (90 Base) MCG/ACT inhaler Commonly known as:  PROVENTIL HFA;VENTOLIN HFA Inhale 1-2 puffs into the lungs every 6 (six) hours as needed for wheezing or shortness of breath.   ALPRAZolam 1 MG tablet Commonly known as:  XANAX Take 1 mg by mouth 2 (two) times daily as needed for sleep or anxiety.   folic acid 1 MG tablet Commonly known as:  FOLVITE Take 1 tablet (1 mg total) by mouth daily. Start taking on:  06/18/2017   lipase/protease/amylase [id number] UNITS Cpep capsule Commonly known as:  CREON Take 4 capsules (144,000 Units total) by mouth 3 (three) times daily with meals.   lisinopril 20 MG tablet Commonly known as:  PRINIVIL,ZESTRIL Take 1 tablet (20 mg total) by mouth daily. Start taking on:  06/18/2017   omeprazole 40 MG capsule Commonly known as:  PRILOSEC TAKE ONE CAPSULE BY MOUTH EVERY DAY FOR acid reflux   oxyCODONE-acetaminophen 7.5-325 MG tablet Commonly known as:  PERCOCET Take 1 tablet by mouth every 6 (six) hours as needed for moderate pain or severe pain.   pantoprazole 40 MG tablet Commonly known as:  PROTONIX Take 1 tablet (40 mg total) by mouth daily. Start taking on:  06/18/2017   promethazine 25 MG tablet Commonly known as:  PHENERGAN take ONE-HALF tablet BY MOUTH TWICE DAILY AS NEEDED FOR nausea/vomiting   QUEtiapine 25 MG tablet Commonly known as:  SEROQUEL TAKE ONE TABLET BY MOUTH AT BEDTIME AS NEEDED FOR SLEEP   venlafaxine XR 75 MG 24 hr capsule Commonly known as:  EFFEXOR-XR TAKE ONE CAPSULE BY MOUTH EVERY DAY FOR DEPRESSION   venlafaxine XR 150 MG 24 hr capsule Commonly known as:  EFFEXOR-XR Take 150 mg by mouth every morning.       Allergies  Allergen Reactions  . Sulfa Antibiotics  Anaphylaxis    Consultations:  GI   Procedures/Studies: Dg Chest 2 View  Result Date: 06/14/2017 CLINICAL DATA:  BILATERAL leg swelling for 2 weeks question pulmonary edema, history asthma, smoking EXAM: CHEST  2 VIEW COMPARISON:  12/25/2015 FINDINGS: Normal heart size, mediastinal contours, and pulmonary vascularity. Lungs minimally hyperinflated but clear. No infiltrate, pleural effusion or pneumothorax. Bones unremarkable. BILATERAL nipple shadows noted. IMPRESSION: No acute abnormalities. Electronically Signed   By: Ulyses SouthwardMark  Boles M.D.   On: 06/14/2017 18:09   Ct Abdomen Pelvis W Contrast  Result Date: 06/14/2017 CLINICAL DATA:  Abdominal distention, nausea and vomiting EXAM: CT ABDOMEN AND PELVIS WITH CONTRAST TECHNIQUE: Multidetector CT imaging of the abdomen and pelvis was performed using the standard protocol following bolus administration of intravenous contrast. CONTRAST:  100mL ISOVUE-300 IOPAMIDOL (ISOVUE-300) INJECTION 61% COMPARISON:  CT abdomen pelvis 12/26/2015 FINDINGS: Lower chest: No basilar pulmonary nodules or pleural effusion. No apical pericardial effusion. Hepatobiliary: Diffuse hepatic steatosis. No focal liver lesion. There is mild fatty sparing at the gallbladder fossa.  Normal gallbladder. Pancreas: Normal parenchymal contours without ductal dilatation. No peripancreatic fluid collection. Spleen: The spleen is partially atrophic. There are multiple coils along the course of the splenic artery. Adrenals/Urinary Tract: --Adrenal glands: Unchanged appearance of right adrenal lesion measuring 1.5 x 0.7 cm with an intermediate attenuation value. Normal left adrenal gland. --Right kidney/ureter: No hydronephrosis, perinephric stranding or nephrolithiasis. No obstructing ureteral stones. --Left kidney/ureter: No hydronephrosis, perinephric stranding or nephrolithiasis. No obstructing ureteral stones. --Urinary bladder: Normal appearance for the degree of distention. Stomach/Bowel:  --Stomach/Duodenum: No hiatal hernia or other gastric abnormality. Normal duodenal course. --Small bowel: No dilatation or inflammation. --Colon: There is long segment wall thickening and surrounding mild inflammatory change extending from the cecum to the mid descending colon. There is no free intraperitoneal air. No abscess. There is a large amount of fluid in the pelvis. --Appendix: Normal. Vascular/Lymphatic: Atherosclerotic calcification is present within the non-aneurysmal abdominal aorta, without hemodynamically significant stenosis. The portal vein, superior mesenteric vein and IVC are patent. No abdominal or pelvic lymphadenopathy. Reproductive: Status post hysterectomy. No adnexal mass. Musculoskeletal. No bony spinal canal stenosis or focal osseous abnormality. Other: None. IMPRESSION: 1. Long segment colitis that extends from the cecum to the mid descending colon. The findings are worst in the proximal colon. 2. No free intraperitoneal air or abscess formation. There is a large amount of fluid in the pelvis. 3.  Aortic Atherosclerosis (ICD10-I70.0). 4. Hepatic steatosis. 5. Unchanged appearance of indeterminate right adrenal lesion. 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 This could be better characterized with a nonemergent abdominal MRI with and without contrast or a multiphase adrenal mass protocol CT. Electronically Signed   By: Deatra Robinson M.D.   On: 06/14/2017 23:27        Discharge Exam: Vitals:   06/17/17 0630 06/17/17 1140  BP: 125/83 119/85  Pulse: 96 97  Resp: 18   Temp: 98 F (36.7 C)   SpO2: 98%    Vitals:   06/16/17 2050 06/16/17 2241 06/17/17 0630 06/17/17 1140  BP:  113/78 125/83 119/85  Pulse:  (!) 101 96 97  Resp:  18 18   Temp:   98 F (36.7 C)   TempSrc:   Oral   SpO2: 96% 99% 98%   Weight:   58.6 kg (129 lb 3 oz)   Height:        General: Pt is alert, awake, not in acute distress Cardiovascular: RRR, S1/S2 +, no rubs, no gallops Respiratory: CTA  bilaterally, no wheezing, no rhonchi Abdominal: Soft, NT, ND, bowel sounds + Extremities: no edema, no cyanosis   The results of significant diagnostics from this hospitalization (including imaging, microbiology, ancillary and laboratory) are listed below for reference.    Significant Diagnostic Studies: Dg Chest 2 View  Result Date: 06/14/2017 CLINICAL DATA:  BILATERAL leg swelling for 2 weeks question pulmonary edema, history asthma, smoking EXAM: CHEST  2 VIEW COMPARISON:  12/25/2015 FINDINGS: Normal heart size, mediastinal contours, and pulmonary vascularity. Lungs minimally hyperinflated but clear. No infiltrate, pleural effusion or pneumothorax. Bones unremarkable. BILATERAL nipple shadows noted. IMPRESSION: No acute abnormalities. Electronically Signed   By: Ulyses Southward M.D.   On: 06/14/2017 18:09   Ct Abdomen Pelvis W Contrast  Result Date: 06/14/2017 CLINICAL DATA:  Abdominal distention, nausea and vomiting EXAM: CT ABDOMEN AND PELVIS WITH CONTRAST TECHNIQUE: Multidetector CT imaging of the abdomen and pelvis was performed using the standard protocol following bolus administration of intravenous contrast. CONTRAST:  ISOVUE-300 IOPAMIDOL (ISOVUE-300) INJECTION 61% COMPARISON:  CT abdomen pelvis 12/26/2015 FINDINGS: Lower chest: No basilar pulmonary nodules or pleural effusion. No apical pericardial effusion. Hepatobiliary: Diffuse hepatic steatosis. No focal liver lesion. There is mild fatty sparing at the gallbladder fossa. Normal gallbladder. Pancreas: Normal parenchymal contours without ductal dilatation. No peripancreatic fluid collection. Spleen: The spleen is partially atrophic. There are multiple coils along the course of the splenic artery. Adrenals/Urinary Tract: --Adrenal glands: Unchanged appearance of right adrenal lesion measuring 1.5 x 0.7 cm with an intermediate attenuation value. Normal left adrenal gland. --Right kidney/ureter: No hydronephrosis, perinephric stranding or  nephrolithiasis. No obstructing ureteral stones. --Left kidney/ureter: No hydronephrosis, perinephric stranding or nephrolithiasis. No obstructing ureteral stones. --Urinary bladder: Normal appearance for the degree of distention. Stomach/Bowel: --Stomach/Duodenum: No hiatal hernia or other gastric abnormality. Normal duodenal course. --Small bowel: No dilatation or inflammation. --Colon: There is long segment wall thickening and surrounding mild inflammatory change extending from the cecum to the mid descending colon. There is no free intraperitoneal air. No abscess. There is a large amount of fluid in the pelvis. --Appendix: Normal. Vascular/Lymphatic: Atherosclerotic calcification is present within the non-aneurysmal abdominal aorta, without hemodynamically significant stenosis. The portal vein, superior mesenteric vein and IVC are patent. No abdominal or pelvic lymphadenopathy. Reproductive: Status post hysterectomy. No adnexal mass. Musculoskeletal. No bony spinal canal stenosis or focal osseous abnormality. Other: None. IMPRESSION: 1. Long segment colitis that extends from the cecum to the mid descending colon. The findings are worst in the proximal colon. 2. No free intraperitoneal air or abscess formation. There is a large amount of fluid in the pelvis. 3.  Aortic Atherosclerosis (ICD10-I70.0). 4. Hepatic steatosis. 5. Unchanged appearance of indeterminate right adrenal lesion. 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 This could be better characterized with a nonemergent abdominal MRI with and without contrast or a multiphase adrenal mass protocol CT. Electronically Signed   By: Deatra Robinson M.D.   On: 06/14/2017 23:27     Microbiology: No results found for this or any previous visit (from the past 240 hour(s)).   Labs: Basic Metabolic Panel: Recent Labs  Lab 06/14/17 1727 06/15/17 0522 06/16/17 0433 06/17/17 0513  NA 136 134* 134* 135  K 2.5* 3.4* 4.8 4.0  CL 96* 99* 100* 101  CO2 25  22 25 25   GLUCOSE 104* 127* 118* 102*  BUN <5* <5* <5* <5*  CREATININE 0.51 0.40* 0.50 0.57  CALCIUM 7.7* 7.1* 7.5* 7.8*  MG 1.4* 1.6* 1.8 1.7   Liver Function Tests: Recent Labs  Lab 06/14/17 1727 06/15/17 0522 06/16/17 0433 06/17/17 0513  AST 77* 100* 97* 83*  ALT 55* 52 55* 53  ALKPHOS 229* 224* 216* 204*  BILITOT 1.6* 1.9* 2.6* 2.3*  PROT 4.8* 4.4* 4.6* 4.4*  ALBUMIN 1.7* 1.6* 1.7* 1.6*   Recent Labs  Lab 06/14/17 1727  LIPASE 15   Recent Labs  Lab 06/15/17 1626  AMMONIA 32   CBC: Recent Labs  Lab 06/14/17 1727 06/15/17 0522 06/16/17 0433 06/17/17 0513  WBC 7.2 6.8 7.2 7.0  NEUTROABS 4.4  --   --   --   HGB 8.7* 7.5* 9.0* 9.3*  HCT 27.8* 23.2* 28.1* 29.8*  MCV 110.8* 106.4* 103.3* 106.4*  PLT 228 208 186 201   Cardiac Enzymes: Recent Labs  Lab 06/14/17 1727  TROPONINI <0.03   BNP: Invalid input(s): POCBNP CBG: No results for input(s): GLUCAP in the last 168 hours.  Time coordinating discharge:  Greater than 30 minutes  Signed:  Catarina Hartshorn, DO [facility] Pager:  [phone] 06/17/2017, 1:29 PM

## 2017-06-17 NOTE — Anesthesia Postprocedure Evaluation (Signed)
Anesthesia Post Note  Patient: Kendra Cox  Procedure(s) Performed: ESOPHAGOGASTRODUODENOSCOPY (EGD) (N/A ) BIOPSY SAVORY DILATION  Patient location during evaluation: Nursing Unit Anesthesia Type: MAC Level of consciousness: awake and alert, oriented and patient cooperative Pain management: pain level controlled Vital Signs Assessment: post-procedure vital signs reviewed and stable Respiratory status: spontaneous breathing Cardiovascular status: stable : Reports some nausea; patient admitted with nausea. Anesthetic complications: no     Last Vitals:  Vitals:   06/16/17 2241 06/17/17 0630  BP: 113/78 125/83  Pulse: (!) 101 96  Resp: 18 18  Temp:  36.7 C  SpO2: 99% 98%    Last Pain:  Vitals:   06/17/17 0630  TempSrc: Oral  PainSc:                  ADAMS, AMY A

## 2017-06-17 NOTE — Addendum Note (Signed)
Addendum  created 06/17/17 0743 by Earleen NewportAdams, Arnel Wymer A, CRNA   Sign clinical note

## 2017-06-17 NOTE — Care Management Note (Addendum)
Case Management Note  Patient Details  Name: Kendra Cox MRN: [id number] Date of Birth: [birth year]  Subjective/Objective:          Admitted with hypokalemia. Pt is from home, ind, has no insurance, unemployed. [facility] She has applied for medicaid. She needed to follow up with GI after last DC but could not afford to pay OOP and never received FA paperwork in mail. She has no been taking her creon d/t the price.         Action/Plan: DC home today. Pt has been given MATCH voucher to help cover cost (may not cover all cost) for months supply of creon. She has been given a completed applications (she must attach tax info) for financial assistance through needymeds. Attending filled out first Rx for application and pt given blank copy to take to PCP to fill out for additional refills. CM asked FC to see pt and if possible provide application for [employer] assistance program before DC. FC could not provide application before DC and said she would mail to patient.    Expected Discharge Date:  06/17/17               Expected Discharge Plan:  Home/Self Care  In-House Referral:  Financial Counselor  Discharge planning Services  Medication Assistance, CM Consult  Status of Service:  Completed, signed off  If discussed at Long Length of Stay Meetings, dates discussed:    Additional Comments:  Malcolm MetroChildress, Kijana Cromie Demske, RN 06/17/2017, 2:01 PM

## 2017-06-17 NOTE — Progress Notes (Signed)
Subjective:  Two BMs and no vomiting in the last 24 hours. Feels nauseated. Worried about not getting her Xanax and Percocet.   Objective: Vital signs in last 24 hours: Temp:  [98 F (36.7 C)-99 F (37.2 C)] 98 F (36.7 C) (01/30 0630) Pulse Rate:  [96-115] 96 (01/30 0630) Resp:  [0-63] 18 (01/30 0630) BP: (113-163)/(78-117) 125/83 (01/30 0630) SpO2:  [85 %-100 %] 98 % (01/30 0630) Weight:  [129 lb 3 oz (58.6 kg)] 129 lb 3 oz (58.6 kg) (01/30 0630) Last BM Date: 06/16/17 General:   Alert,  Appears older than state age. cooperative in NAD Head:  Normocephalic and atraumatic. Eyes:  Sclera clear, no icterus.  Abdomen:  Soft, nontender and nondistended.   Normal bowel sounds, without guarding, and without rebound.   Extremities:  Without clubbing, deformity or edema. Neurologic:  Alert and  oriented x4;  grossly normal neurologically. Skin:  Intact without significant lesions or rashes. Psych:  Alert and cooperative. Normal mood and affect.  Intake/Output from previous day: 01/29 0701 - 01/30 0700 In: 960 [P.O.:60; I.V.:900] Out: 1000 [Urine:1000] Intake/Output this shift: No intake/output data recorded.  Lab Results: CBC Recent Labs    06/15/17 0522 06/16/17 0433 06/17/17 0513  WBC 6.8 7.2 7.0  HGB 7.5* 9.0* 9.3*  HCT 23.2* 28.1* 29.8*  MCV 106.4* 103.3* 106.4*  PLT 208 186 201   BMET Recent Labs    06/15/17 0522 06/16/17 0433 06/17/17 0513  NA 134* 134* 135  K 3.4* 4.8 4.0  CL 99* 100* 101  CO2 22 25 25   GLUCOSE 127* 118* 102*  BUN <5* <5* <5*  CREATININE 0.40* 0.50 0.57  CALCIUM 7.1* 7.5* 7.8*   LFTs Recent Labs    06/15/17 0522 06/16/17 0433 06/17/17 0513  BILITOT 1.9* 2.6* 2.3*  ALKPHOS 224* 216* 204*  AST 100* 97* 83*  ALT 52 55* 53  PROT 4.4* 4.6* 4.4*  ALBUMIN 1.6* 1.7* 1.6*   Recent Labs    06/14/17 1727  LIPASE 15   PT/INR Recent Labs    06/15/17 0522  LABPROT 15.7*  INR 1.26      Imaging Studies: Dg Chest 2 View  Result  Date: 06/14/2017 CLINICAL DATA:  BILATERAL leg swelling for 2 weeks question pulmonary edema, history asthma, smoking EXAM: CHEST  2 VIEW COMPARISON:  12/25/2015 FINDINGS: Normal heart size, mediastinal contours, and pulmonary vascularity. Lungs minimally hyperinflated but clear. No infiltrate, pleural effusion or pneumothorax. Bones unremarkable. BILATERAL nipple shadows noted. IMPRESSION: No acute abnormalities. Electronically Signed   By: Ulyses Southward M.D.   On: 06/14/2017 18:09   Ct Abdomen Pelvis W Contrast  Result Date: 06/14/2017 CLINICAL DATA:  Abdominal distention, nausea and vomiting EXAM: CT ABDOMEN AND PELVIS WITH CONTRAST TECHNIQUE: Multidetector CT imaging of the abdomen and pelvis was performed using the standard protocol following bolus administration of intravenous contrast. CONTRAST:  ISOVUE-300 IOPAMIDOL (ISOVUE-300) INJECTION 61% COMPARISON:  CT abdomen pelvis 12/26/2015 FINDINGS: Lower chest: No basilar pulmonary nodules or pleural effusion. No apical pericardial effusion. Hepatobiliary: Diffuse hepatic steatosis. No focal liver lesion. There is mild fatty sparing at the gallbladder fossa. Normal gallbladder. Pancreas: Normal parenchymal contours without ductal dilatation. No peripancreatic fluid collection. Spleen: The spleen is partially atrophic. There are multiple coils along the course of the splenic artery. Adrenals/Urinary Tract: --Adrenal glands: Unchanged appearance of right adrenal lesion measuring 1.5 x 0.7 cm with an intermediate attenuation value. Normal left adrenal gland. --Right kidney/ureter: No hydronephrosis, perinephric stranding or nephrolithiasis. No obstructing  ureteral stones. --Left kidney/ureter: No hydronephrosis, perinephric stranding or nephrolithiasis. No obstructing ureteral stones. --Urinary bladder: Normal appearance for the degree of distention. Stomach/Bowel: --Stomach/Duodenum: No hiatal hernia or other gastric abnormality. Normal duodenal course.  --Small bowel: No dilatation or inflammation. --Colon: There is long segment wall thickening and surrounding mild inflammatory change extending from the cecum to the mid descending colon. There is no free intraperitoneal air. No abscess. There is a large amount of fluid in the pelvis. --Appendix: Normal. Vascular/Lymphatic: Atherosclerotic calcification is present within the non-aneurysmal abdominal aorta, without hemodynamically significant stenosis. The portal vein, superior mesenteric vein and IVC are patent. No abdominal or pelvic lymphadenopathy. Reproductive: Status post hysterectomy. No adnexal mass. Musculoskeletal. No bony spinal canal stenosis or focal osseous abnormality. Other: None. IMPRESSION: 1. Long segment colitis that extends from the cecum to the mid descending colon. The findings are worst in the proximal colon. 2. No free intraperitoneal air or abscess formation. There is a large amount of fluid in the pelvis. 3.  Aortic Atherosclerosis (ICD10-I70.0). 4. Hepatic steatosis. 5. Unchanged appearance of indeterminate right adrenal lesion. 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 This could be better characterized with a nonemergent abdominal MRI with and without contrast or a multiphase adrenal mass protocol CT. Electronically Signed   By: Deatra RobinsonKevin  Herman M.D.   On: 06/14/2017 23:27  [2 weeks]   Assessment:  52 year old female with history of chronic alcohol abuse, presenting to the ED with progressive fatigue, weakness, lower extremity edema. Multiple laboratory abnormalities noted with severe hypokalemia, hypoalbuminemia, anemia, elevated LFTs. Previously evaluated recently by Dr. Kinnie ScalesMedoff in [place] but unable to pursue colonoscopy/EGD as outpatient due to pending insurance.   Chronic abdominal pain: very limited historian. No prior colonoscopy/EGD. CT with long-segment colitis. Pancreatic elastase done in [place] consistent with severe pancreatic insufficiency, but she has not been  taking Creon as prescribed due to inability to afford. Outside celiac screen negative per notes in Care Everywhere.   Anemia: Hgb 11.1 in Sept 2018, now down to 7.5 this admission (admitting 8.7). Unknown hemoccult status. No overt GI bleeding. Anemia panel with elevated ferritin but this is an acute phase reactant, and there are [facility] that she had IDA. Anemia multifactorial in setting of chronic ETOH abuse, malnutrition. Transfuse as needed.   Elevated LFTs: in setting of chronic ETOH abuse, fatty liver. Hep B surface antibody, Hep B core antibody, Hep C antibody, and Hep A antibody all negative in Sept 2018. DF less than 32. Mild improvement this admission.   N/V: vague dysphagia reported. EGD yesterday without esophageal abnormality but dilated given symptoms. PPI started.   Weight loss: multifactorial in setting of N/V, poor intake, chronic alcohol abuse. Unclear if abdominal pain is related to eating: patient is unable to specify. EGD with gastric mucosal erythema and edema. bx pending. Small bowel appeared normal, s/p bx. Ultimately will need colonoscopy once able to tolerate bowel prep, likely done as outpatient by primary GI, Dr. Kinnie ScalesMedoff.       Plan: 1. F/u path.  2. F/u pending labs.  3. Continue PPI.  4. Patient urged to consider etoh cessation.  5. Creon 144,000 with meals and 72,000 with snacks. Patient should complete patient assistance forms to assist with obtaining medication for her. Will request case manager help. 6. F/u with Dr. Kinnie ScalesMedoff as outpatient.  7. Patient requesting her home medications, xanax and percocet. Leave to discretion of attending.  Leanna BattlesLeslie S. Dixon BoosLewis, PA-C [facility] [phone] 1/30/20199:26 AM  LOS: 3 days

## 2017-06-17 NOTE — Progress Notes (Signed)
Discharge instructions gone over with patient, verbalized understanding. IV removed, patient tolerated procedure well. Printed prescriptions given to patient. 

## 2017-06-18 ENCOUNTER — Encounter (HOSPITAL_COMMUNITY): Payer: Self-pay | Admitting: Gastroenterology

## 2017-06-22 ENCOUNTER — Other Ambulatory Visit: Payer: Self-pay

## 2017-06-22 ENCOUNTER — Encounter (HOSPITAL_COMMUNITY): Payer: Self-pay | Admitting: Emergency Medicine

## 2017-06-22 ENCOUNTER — Emergency Department (HOSPITAL_COMMUNITY)
Admission: EM | Admit: 2017-06-22 | Discharge: 2017-06-23 | Disposition: A | Discharge status: Home / Self Care | Payer: Self-pay | Attending: Emergency Medicine | Admitting: Emergency Medicine

## 2017-06-22 DIAGNOSIS — Z882 Allergy status to sulfonamides status: Secondary | ICD-10-CM | POA: Insufficient documentation

## 2017-06-22 DIAGNOSIS — R600 Localized edema: Secondary | ICD-10-CM | POA: Insufficient documentation

## 2017-06-22 DIAGNOSIS — F10229 Alcohol dependence with intoxication, unspecified: Secondary | ICD-10-CM | POA: Insufficient documentation

## 2017-06-22 DIAGNOSIS — K860 Alcohol-induced chronic pancreatitis: Secondary | ICD-10-CM

## 2017-06-22 DIAGNOSIS — R319 Hematuria, unspecified: Secondary | ICD-10-CM | POA: Insufficient documentation

## 2017-06-22 DIAGNOSIS — F17210 Nicotine dependence, cigarettes, uncomplicated: Secondary | ICD-10-CM | POA: Insufficient documentation

## 2017-06-22 DIAGNOSIS — F10920 Alcohol use, unspecified with intoxication, uncomplicated: Secondary | ICD-10-CM

## 2017-06-22 DIAGNOSIS — Z79899 Other long term (current) drug therapy: Secondary | ICD-10-CM | POA: Insufficient documentation

## 2017-06-22 DIAGNOSIS — I10 Essential (primary) hypertension: Secondary | ICD-10-CM | POA: Insufficient documentation

## 2017-06-22 DIAGNOSIS — F1020 Alcohol dependence, uncomplicated: Secondary | ICD-10-CM

## 2017-06-22 DIAGNOSIS — J45909 Unspecified asthma, uncomplicated: Secondary | ICD-10-CM | POA: Insufficient documentation

## 2017-06-22 LAB — COMPREHENSIVE METABOLIC PANEL
ALT: 67 U/L — ABNORMAL HIGH (ref 14–54)
AST: 74 U/L — ABNORMAL HIGH (ref 15–41)
Albumin: 2 g/dL — ABNORMAL LOW (ref 3.5–5.0)
Alkaline Phosphatase: 239 U/L — ABNORMAL HIGH (ref 38–126)
Anion gap: 11 (ref 5–15)
BUN: 5 mg/dL — ABNORMAL LOW (ref 6–20)
CO2: 25 mmol/L (ref 22–32)
Calcium: 8.2 mg/dL — ABNORMAL LOW (ref 8.9–10.3)
Chloride: 94 mmol/L — ABNORMAL LOW (ref 101–111)
Creatinine, Ser: 0.58 mg/dL (ref 0.44–1.00)
GFR calc Af Amer: >60 mL/min (ref >60)
GFR calc non Af Amer: >60 mL/min (ref >60)
Glucose, Bld: 90 mg/dL (ref 65–99)
Potassium: 3.9 mmol/L (ref 3.5–5.1)
Sodium: 130 mmol/L — ABNORMAL LOW (ref 135–145)
Total Bilirubin: 1.5 mg/dL — ABNORMAL HIGH (ref 0.3–1.2)
Total Protein: 5.4 g/dL — ABNORMAL LOW (ref 6.5–8.1)

## 2017-06-22 LAB — URINALYSIS, ROUTINE W REFLEX MICROSCOPIC
Bilirubin Urine: NEGATIVE
Glucose, UA: NEGATIVE mg/dL
HGB URINE DIPSTICK: NEGATIVE
Ketones, ur: NEGATIVE mg/dL
LEUKOCYTES UA: NEGATIVE
NITRITE: POSITIVE — AB
Protein, ur: NEGATIVE mg/dL
SPECIFIC GRAVITY, URINE: 1.005 (ref 1.005–1.030)
Squamous Epithelial / LPF: NONE SEEN
pH: 7 (ref 5.0–8.0)

## 2017-06-22 LAB — CBC
HEMATOCRIT: 33.3 % — AB (ref 36.0–46.0)
Hemoglobin: 10.7 g/dL — ABNORMAL LOW (ref 12.0–15.0)
MCH: 33.9 pg (ref 26.0–34.0)
MCHC: 32.1 g/dL (ref 30.0–36.0)
MCV: 105.4 fL — ABNORMAL HIGH (ref 78.0–100.0)
Platelets: 263 10*3/uL (ref 150–400)
RBC: 3.16 MIL/uL — AB (ref 3.87–5.11)
RDW: 17.2 % — ABNORMAL HIGH (ref 11.5–15.5)
WBC: 6.3 10*3/uL (ref 4.0–10.5)

## 2017-06-22 LAB — RAPID URINE DRUG SCREEN, HOSP PERFORMED
Amphetamines: NOT DETECTED
BARBITURATES: NOT DETECTED
BENZODIAZEPINES: POSITIVE — AB
COCAINE: NOT DETECTED
OPIATES: NOT DETECTED
TETRAHYDROCANNABINOL: NOT DETECTED

## 2017-06-22 LAB — ETHANOL: Alcohol, Ethyl (B): 121 mg/dL — ABNORMAL HIGH (ref <10)

## 2017-06-22 LAB — MAGNESIUM: Magnesium: 1.6 mg/dL — ABNORMAL LOW (ref 1.7–2.4)

## 2017-06-22 LAB — LIPASE, BLOOD: LIPASE: 16 U/L (ref 11–51)

## 2017-06-22 MED ORDER — SODIUM CHLORIDE 0.9 % IV BOLUS (SEPSIS)
1000.0000 mL | Freq: Once | INTRAVENOUS | Status: AC
  Administered 2017-06-22: 1000 mL via INTRAVENOUS

## 2017-06-22 MED ORDER — ONDANSETRON HCL 4 MG/2ML IJ SOLN
4.0000 mg | Freq: Once | INTRAMUSCULAR | Status: AC
Start: 2017-06-22 — End: 2017-06-22
  Administered 2017-06-22: 4 mg via INTRAVENOUS
  Filled 2017-06-22: qty 2

## 2017-06-22 MED ORDER — CHLORDIAZEPOXIDE HCL 25 MG PO CAPS: ORAL_CAPSULE | ORAL | 0 refills | Status: DC

## 2017-06-22 MED ORDER — MAGNESIUM SULFATE 2 GM/50ML IV SOLN
2.0000 g | Freq: Once | INTRAVENOUS | Status: AC
  Administered 2017-06-22: 2 g via INTRAVENOUS
  Filled 2017-06-22: qty 50

## 2017-06-22 NOTE — ED Provider Notes (Signed)
[facility] EMERGENCY DEPARTMENT Provider Note   CSN: [id number] Arrival date & time: 06/22/17  1706     History   Chief Complaint Chief Complaint  Patient presents with  . Emesis    HPI Kendra Cox is a 52 y.o. female.  She presents for ongoing nausea of her Creon which she takes for digestion.  She was hospitalized, discharged, 5 days ago after evaluation for similar problems.  She had upper endoscopy, without significant findings, after which she was diagnosed with gastritis and reflux disease.  She states that she is "almost out of all of my medicines."  She smokes cigarettes and drinks alcohol but denies use of illegal drugs.  She denies fever.  She complains of swelling in her legs.  She is here with her cousin who transferred her here by private vehicle.  Her cousin states that "she has been like this for 4 months."  There are no other known modifying factors.   HPI  Past Medical History:  Diagnosis Date  . Asthma   . Depression   . Fibromyalgia   . Hypertension   . Pancreatitis   . PTSD (post-traumatic stress disorder)     Patient Active Problem List   Diagnosis Date Noted  . Cyclical vomiting with nausea   . Anemia   . Chronic abdominal pain   . Hypokalemia 06/14/2017  . Malnutrition (HCC) 06/14/2017  . Pancreatic insufficiency 06/14/2017  . Hypoalbuminemia 06/14/2017  . IDA (iron deficiency anemia) 06/14/2017  . Depression 06/14/2017  . PTSD (post-traumatic stress disorder) 06/14/2017  . Generalized weakness 06/14/2017  . Abnormal weight loss 06/14/2017  . Asthma 06/14/2017  . Hypertension 06/14/2017  . Fibromyalgia 06/14/2017  . Sinus tachycardia 06/14/2017  . Current every day smoker 06/14/2017  . Chronic alcohol abuse 06/14/2017  . Hypomagnesemia 06/14/2017  . Hyperbilirubinemia 06/14/2017  . Prolonged Q-T interval on ECG 06/14/2017  . Pancreatitis, acute 12/26/2015  . Chest pain 12/25/2015  . SOB (shortness of breath) 12/25/2015    Past  Surgical History:  Procedure Laterality Date  . ABDOMINAL HYSTERECTOMY    . BIOPSY  06/16/2017   Procedure: BIOPSY;  Surgeon: West Bali, MD;  Location: [facility] END[facility];  Service: Endoscopy;;  . BREAST SURGERY    . ESOPHAGOGASTRODUODENOSCOPY N/A 06/16/2017   Procedure: ESOPHAGOGASTRODUODENOSCOPY (EGD);  Surgeon: West Bali, MD;  Location: [facility] ENDO SUITE;  Service: Endoscopy;  Laterality: N/A;  . SAVORY DILATION  06/16/2017   Procedure: SAVORY DILATION;  Surgeon: West Bali, MD;  Location: [facility] ENDO SU[facility];  Service: Endoscopy;;  . thumb surgery      OB History    No data available       Home Medications    Prior to Admission medications   Medication Sig Start Date End Date Taking? Authorizing Provider  albuterol (PROVENTIL HFA;VENTOLIN HFA) 108 (90 Base) MCG/ACT inhaler Inhale 1-2 puffs into the lungs every 6 (six) hours as needed for wheezing or shortness of breath. 08/06/15  Yes Cheron Schaumann K, PA-C  ALPRAZolam Prudy Feeler) 1 MG tablet Take 1 mg by mouth 3 (three) times daily as needed for sleep or anxiety.    Yes [provider]  folic acid (FOLVITE) 1 MG tablet Take 1 tablet (1 mg total) by mouth daily. 06/18/17  Yes Tat, Onalee Hua, MD  lipase/protease/amylase (CREON) 36000 UNITS CPEP capsule Take 4 capsules (144,000 Units total) by mouth 3 (three) times daily with meals. 06/17/17  Yes Tat, Onalee Hua, MD  lisinopril (PRINIVIL,ZESTRIL) 20 MG tablet  Take 1 tablet (20 mg total) by mouth daily. 06/18/17  Yes Tat, Onalee Huaavid, MD  oxyCODONE-acetaminophen (PERCOCET) 7.5-325 MG tablet Take 1 tablet by mouth every 6 (six) hours as needed for moderate pain or severe pain.   Yes [provider]  pantoprazole (PROTONIX) 40 MG tablet Take 1 tablet (40 mg total) by mouth daily. 06/18/17  Yes Tat, Onalee Huaavid, MD  promethazine (PHENERGAN) 25 MG tablet take ONE-HALF tablet BY MOUTH TWICE DAILY AS NEEDED FOR nausea/vomiting 05/27/17  Yes [provider]  QUEtiapine (SEROQUEL) 25 MG tablet TAKE  ONE TABLET BY MOUTH AT BEDTIME AS NEEDED FOR SLEEP 05/27/17  Yes [provider]  venlafaxine XR (EFFEXOR-XR) 150 MG 24 hr capsule Take 150 mg by mouth every morning.   Yes [provider]  venlafaxine XR (EFFEXOR-XR) 75 MG 24 hr capsule TAKE ONE CAPSULE BY MOUTH EVERY DAY FOR DEPRESSION 05/27/17  Yes [provider]  chlordiazePOXIDE (LIBRIUM) 25 MG capsule 50mg  PO TID x 1D, then 25-50mg  PO BID X 1D, then 25-50mg  PO QD X 1D  For assistance with alcohol withdrawal symptoms 06/22/17   Mancel BaleWentz, Celisa Schoenberg, MD    Family History Family History  Problem Relation Age of Onset  . Colon cancer Brother 6034       deceased    Social History Social History   Tobacco Use  . Smoking status: Current Every Day Smoker    Packs/day: 1.00    Types: Cigarettes  . Smokeless tobacco: Never Used  Substance Use Topics  . Alcohol use: Yes    Comment: per family report, pt drinks every day, "she is drunk every night"  . Drug use: Yes    Types: Oxycodone    Comment: per family report     Allergies   Sulfa antibiotics   Review of Systems Review of Systems  All other systems reviewed and are negative.    Physical Exam Updated Vital Signs BP 130/89 (BP Location: Right Arm)   Pulse 95   Temp 98.6 F (37 C) (Oral)   Resp 15   Ht 5\' 2"  (1.575 m)   Wt 58.5 kg (129 lb)   SpO2 97%   BMI 23.59 kg/m   Physical Exam  Constitutional: She is oriented to person, place, and time. She appears well-developed and well-nourished.  HENT:  Head: Normocephalic and atraumatic.  Eyes: Conjunctivae and EOM are normal. Pupils are equal, round, and reactive to light.  Neck: Normal range of motion and phonation normal. Neck supple.  Cardiovascular: Normal rate and regular rhythm.  Pulmonary/Chest: Effort normal and breath sounds normal. She exhibits no tenderness.  Abdominal: Soft. She exhibits no distension. There is tenderness (Diffuse, mild). There is no guarding.  Musculoskeletal: Normal  range of motion. She exhibits edema (2-3+ pitting edema both lower legs.). She exhibits no tenderness or deformity.  Neurological: She is alert and oriented to person, place, and time. She exhibits normal muscle tone.  Skin: Skin is warm and dry.  Psychiatric: Her behavior is normal. Judgment and thought content normal.  She appears depressed  Nursing note and vitals reviewed.    ED Treatments / Results  Labs (all labs ordered are listed, but only abnormal results are displayed) Labs Reviewed  COMPREHENSIVE METABOLIC PANEL - Abnormal; Notable for the following components:      Result Value   Sodium 130 (*)    Chloride 94 (*)    BUN 5 (*)    Calcium 8.2 (*)    Total Protein 5.4 (*)  Albumin 2.0 (*)    AST 74 (*)    ALT 67 (*)    Alkaline Phosphatase 239 (*)    Total Bilirubin 1.5 (*)    All other components within normal limits  CBC - Abnormal; Notable for the following components:   RBC 3.16 (*)    Hemoglobin 10.7 (*)    HCT 33.3 (*)    MCV 105.4 (*)    RDW 17.2 (*)    All other components within normal limits  URINALYSIS, ROUTINE W REFLEX MICROSCOPIC - Abnormal; Notable for the following components:   Nitrite POSITIVE (*)    Bacteria, UA RARE (*)    All other components within normal limits  RAPID URINE DRUG SCREEN, HOSP PERFORMED - Abnormal; Notable for the following components:   Benzodiazepines POSITIVE (*)    All other components within normal limits  ETHANOL - Abnormal; Notable for the following components:   Alcohol, Ethyl (B) 121 (*)    All other components within normal limits  MAGNESIUM - Abnormal; Notable for the following components:   Magnesium 1.6 (*)    All other components within normal limits  LIPASE, BLOOD    EKG  EKG Interpretation  Date/Time:  Monday June 22 2017 22:04:10 EST Ventricular Rate:  84 PR Interval:    QRS Duration: 91 QT Interval:  406 QTC Calculation: 480 R Axis:   7 Text Interpretation:  Sinus rhythm Since last tracing  rate slower and QT is now normal Reconfirmed by Mancel Bale [id number]) on 06/22/2017 10:26:38 PM       Radiology No results found.  Procedures Procedures (including critical care time)  Medications Ordered in ED Medications  sodium chloride 0.9 % bolus 1,000 mL (0 mLs Intravenous Stopped 06/23/17 0001)  ondansetron (ZOFRAN) injection 4 mg (4 mg Intravenous Given 06/22/17 2235)  magnesium sulfate IVPB 2 g 50 mL (0 g Intravenous Stopped 06/23/17 0001)     Initial Impression / Assessment and Plan / ED Course  I have reviewed the triage vital signs and the nursing notes.  Pertinent labs & imaging results that were available during my care of the patient were reviewed by me and considered in my medical decision making (see chart for details).  Clinical Course as of Jun 23 1545  Mon Jun 22, 2017  2145 HCT: (!) 33.3 [EW]  2227 Low, will supplement Magnesium: (!) 1.6 [EW]    Clinical Course User Index [EW] Mancel Bale, MD     Patient Vitals for the past 24 hrs:  BP Temp Temp src Pulse Resp SpO2 Height Weight  06/23/17 0001 130/89 - - 95 15 97 % - -  06/22/17 1742 (!) 137/99 98.6 F (37 C) Oral 100 18 95 % 5\' 2"  (1.575 m) 58.5 kg (129 lb)    At D/C Reevaluation with update and discussion. After initial assessment and treatment, an updated evaluation reveals the findings were discussed with the patient.  She was confronted with her alcoholism, and acute alcohol intoxication.  We discussed how her multiple medical problems are likely related to chronic alcohol abuse, and will likely improve with cessation of alcohol.  She initially asked for refills on her medicines, but then was informed that she has already gone prescription at hospital discharge, for these medicines, several days ago.  I explained to her that I could not refill her chronic medications of Xanax and Percocet.  She stated that she is out of her Xanax.  We discussed treatment of alcohol withdrawal symptoms, and I  committed to  giving her a prescription of Librium, to help her through that.  She is encouraged to follow-up with her PCP as soon as possible to arrange for ongoing management.  Patient was thankful for the help I was offering and stated that she was committed to cessation of alcohol.  All questions were answered.Mancel Bale     Final Clinical Impressions(s) / ED Diagnoses   Final diagnoses:  Alcoholism (HCC)  Alcoholic intoxication without complication (HCC)  Hypomagnesemia  Alcohol-induced chronic pancreatitis (HCC)   Multiple complaints, and medical problems related to alcoholism.  No indication for hospitalization.  Alcohol level is elevated at 121 indicating significant intoxication.  Urine drug screen is positive for benzodiazepines, which she is prescribed.  Magnesium is mildly low, at 1.6 and she is given a 2 g supplement, in the emergency department.  Screening metabolic panel indicates slightly low sodium at 130, low chloride 94, low calcium 8.2, low protein 5.4 low albumin, 2.0.  CBC remarkable for normal white count and slightly low hemoglobin 10.7.  Urinalysis is normal, except positive nitrite.  Patient does not have urinary tract symptoms.  No significant white cells or bacteria in urine.  Doubt UTI.  Nursing Notes Reviewed/ Care Coordinated Applicable Imaging Reviewed Interpretation of Laboratory Data incorporated into ED treatment  The patient appears reasonably screened and/or stabilized for discharge and I doubt any other medical condition or other [facility] requiring further screening, evaluation, or treatment in the ED at this time prior to discharge.  Plan: Home Medications-continue current medications; Home Treatments-avoid all forms of alcohol; return here if the recommended treatment, does not improve the symptoms; Recommended follow up-PCP follow-up 5-7 days and as needed   ED Discharge Orders        Ordered    chlordiazePOXIDE (LIBRIUM) 25 MG capsule     06/22/17 2352         Mancel Bale, MD 06/23/17 1554

## 2017-06-22 NOTE — ED Triage Notes (Signed)
Pt was released from hospital on Thursday for pancreatitis. States she could not afford the prescriptions and is not any better. V/ X8 today D/ X5, state she had an episode of hematuria today.

## 2017-06-22 NOTE — Discharge Instructions (Signed)
Your problems are related to your heavy use of alcohol, and chronic alcoholism.  Avoid using all forms of alcohol.  We are prescribing a medicine, to help you avoid withdrawal symptoms of alcohol,chlordiazepoxide.   You received all the other appropriate medicines, as prescriptions when you are discharged

## 2017-06-23 NOTE — ED Notes (Addendum)
Pt wheeled to waiting room. Pt verbalized understanding of discharge instructions.   

## 2017-08-10 ENCOUNTER — Telehealth: Payer: Self-pay

## 2017-08-10 NOTE — Telephone Encounter (Signed)
Pt said she is wanting to transfer here and she will sigh a release with Dr. Kinnie ScalesMedoff.

## 2017-08-10 NOTE — Telephone Encounter (Signed)
PT called to say she has been sick for several weeks with nausea, vomiting and diarrhea. Said she has problems with her pancreas and could not afford the Creon. She has lost from 160 lbs to 101 pounds in the last 9 months. She wanted a call from Dr. Darrick PennaFields, but is aware that Dr. Darrick PennaFields is at the hospital doing procedures and I will send her a message. Please advise!

## 2017-08-10 NOTE — Telephone Encounter (Signed)
PLEASE CALL PT. HER I DOC IS DR. MEDOFF UNLESS SHE IS TRANSFERRING HER CARE TO [place].

## 2017-08-11 NOTE — Telephone Encounter (Signed)
PLEASE CALL PT. If she does not take Creon #50 she will ave diarrhea and lose weight. She should pick up samples and complete paperwork for patient assistance. STRICTLY FOLLOW A LOW FAT DIET. STOP DRINKING ETOH AND STOP SMOKING. REFER TO NUTRITION FOR WEIGHT LOSS/PANCREATITIS/DIARRHEA. INSTRUCT ION A LOW FAT DIET.

## 2017-08-12 NOTE — Telephone Encounter (Signed)
I have release up front ready for her sign.

## 2017-08-12 NOTE — Telephone Encounter (Signed)
Pt is aware and will come by for the samples and paperwork for Patient Assistance for the Creon.  She said she had called Dr. Jennye BoroughsMedoff's office and no one had returned call. She will sign release here today.

## 2017-09-07 ENCOUNTER — Telehealth: Payer: Self-pay

## 2017-09-07 NOTE — Telephone Encounter (Signed)
I have faxed the Patient Assistance paperwork for Creon.

## 2017-09-21 ENCOUNTER — Inpatient Hospital Stay (HOSPITAL_COMMUNITY)
Admission: EM | Admit: 2017-09-21 | Discharge: 2017-09-25 | DRG: 193 | Disposition: A | Discharge status: Home / Self Care | Payer: Self-pay | Attending: Family Medicine | Admitting: Family Medicine

## 2017-09-21 ENCOUNTER — Encounter (HOSPITAL_COMMUNITY): Payer: Self-pay | Admitting: Emergency Medicine

## 2017-09-21 ENCOUNTER — Other Ambulatory Visit: Payer: Self-pay

## 2017-09-21 ENCOUNTER — Emergency Department (HOSPITAL_COMMUNITY): Payer: Self-pay

## 2017-09-21 DIAGNOSIS — Z90710 Acquired absence of both cervix and uterus: Secondary | ICD-10-CM

## 2017-09-21 DIAGNOSIS — L989 Disorder of the skin and subcutaneous tissue, unspecified: Secondary | ICD-10-CM

## 2017-09-21 DIAGNOSIS — Z681 Body mass index (BMI) 19 or less, adult: Secondary | ICD-10-CM

## 2017-09-21 DIAGNOSIS — D509 Iron deficiency anemia, unspecified: Secondary | ICD-10-CM | POA: Diagnosis present

## 2017-09-21 DIAGNOSIS — E876 Hypokalemia: Secondary | ICD-10-CM | POA: Diagnosis present

## 2017-09-21 DIAGNOSIS — I10 Essential (primary) hypertension: Secondary | ICD-10-CM | POA: Diagnosis present

## 2017-09-21 DIAGNOSIS — Z882 Allergy status to sulfonamides status: Secondary | ICD-10-CM

## 2017-09-21 DIAGNOSIS — R1010 Upper abdominal pain, unspecified: Secondary | ICD-10-CM

## 2017-09-21 DIAGNOSIS — R634 Abnormal weight loss: Secondary | ICD-10-CM

## 2017-09-21 DIAGNOSIS — Z7951 Long term (current) use of inhaled steroids: Secondary | ICD-10-CM

## 2017-09-21 DIAGNOSIS — L509 Urticaria, unspecified: Secondary | ICD-10-CM | POA: Diagnosis present

## 2017-09-21 DIAGNOSIS — Z8719 Personal history of other diseases of the digestive system: Secondary | ICD-10-CM

## 2017-09-21 DIAGNOSIS — J45909 Unspecified asthma, uncomplicated: Secondary | ICD-10-CM | POA: Diagnosis present

## 2017-09-21 DIAGNOSIS — L309 Dermatitis, unspecified: Secondary | ICD-10-CM | POA: Diagnosis present

## 2017-09-21 DIAGNOSIS — F4310 Post-traumatic stress disorder, unspecified: Secondary | ICD-10-CM | POA: Diagnosis present

## 2017-09-21 DIAGNOSIS — R21 Rash and other nonspecific skin eruption: Secondary | ICD-10-CM | POA: Diagnosis present

## 2017-09-21 DIAGNOSIS — I4581 Long QT syndrome: Secondary | ICD-10-CM | POA: Diagnosis present

## 2017-09-21 DIAGNOSIS — F17210 Nicotine dependence, cigarettes, uncomplicated: Secondary | ICD-10-CM | POA: Diagnosis present

## 2017-09-21 DIAGNOSIS — J189 Pneumonia, unspecified organism: Principal | ICD-10-CM | POA: Diagnosis present

## 2017-09-21 DIAGNOSIS — Z79891 Long term (current) use of opiate analgesic: Secondary | ICD-10-CM

## 2017-09-21 DIAGNOSIS — E43 Unspecified severe protein-calorie malnutrition: Secondary | ICD-10-CM | POA: Diagnosis present

## 2017-09-21 DIAGNOSIS — Z79899 Other long term (current) drug therapy: Secondary | ICD-10-CM

## 2017-09-21 DIAGNOSIS — F329 Major depressive disorder, single episode, unspecified: Secondary | ICD-10-CM | POA: Diagnosis present

## 2017-09-21 DIAGNOSIS — E8809 Other disorders of plasma-protein metabolism, not elsewhere classified: Secondary | ICD-10-CM

## 2017-09-21 DIAGNOSIS — M797 Fibromyalgia: Secondary | ICD-10-CM | POA: Diagnosis present

## 2017-09-21 DIAGNOSIS — K8689 Other specified diseases of pancreas: Secondary | ICD-10-CM | POA: Diagnosis present

## 2017-09-21 DIAGNOSIS — E46 Unspecified protein-calorie malnutrition: Secondary | ICD-10-CM | POA: Diagnosis present

## 2017-09-21 DIAGNOSIS — F32A Depression, unspecified: Secondary | ICD-10-CM | POA: Diagnosis present

## 2017-09-21 DIAGNOSIS — K529 Noninfective gastroenteritis and colitis, unspecified: Secondary | ICD-10-CM | POA: Diagnosis present

## 2017-09-21 DIAGNOSIS — R197 Diarrhea, unspecified: Secondary | ICD-10-CM

## 2017-09-21 DIAGNOSIS — F1010 Alcohol abuse, uncomplicated: Secondary | ICD-10-CM | POA: Diagnosis present

## 2017-09-21 LAB — COMPREHENSIVE METABOLIC PANEL
ALT: 25 U/L (ref 14–54)
AST: 40 U/L (ref 15–41)
Albumin: 1.8 g/dL — ABNORMAL LOW (ref 3.5–5.0)
Alkaline Phosphatase: 179 U/L — ABNORMAL HIGH (ref 38–126)
Anion gap: 8 (ref 5–15)
BUN: 8 mg/dL (ref 6–20)
CHLORIDE: 99 mmol/L — AB (ref 101–111)
CO2: 28 mmol/L (ref 22–32)
CREATININE: 0.56 mg/dL (ref 0.44–1.00)
Calcium: 7.9 mg/dL — ABNORMAL LOW (ref 8.9–10.3)
GFR calc non Af Amer: >60 mL/min (ref >60)
Glucose, Bld: 107 mg/dL — ABNORMAL HIGH (ref 65–99)
Potassium: 2.7 mmol/L — CL (ref 3.5–5.1)
SODIUM: 135 mmol/L (ref 135–145)
Total Bilirubin: 1.1 mg/dL (ref 0.3–1.2)
Total Protein: 5.2 g/dL — ABNORMAL LOW (ref 6.5–8.1)

## 2017-09-21 LAB — CBC WITH DIFFERENTIAL/PLATELET
BASOS ABS: 0 10*3/uL (ref 0.0–0.1)
BASOS PCT: 0 %
EOS ABS: 0.2 10*3/uL (ref 0.0–0.7)
EOS PCT: 3 %
HCT: 26.8 % — ABNORMAL LOW (ref 36.0–46.0)
Hemoglobin: 9.2 g/dL — ABNORMAL LOW (ref 12.0–15.0)
LYMPHS ABS: 1.7 10*3/uL (ref 0.7–4.0)
LYMPHS PCT: 24 %
MCH: 34.1 pg — ABNORMAL HIGH (ref 26.0–34.0)
MCHC: 34.3 g/dL (ref 30.0–36.0)
MCV: 99.3 fL (ref 78.0–100.0)
MONO ABS: 0.5 10*3/uL (ref 0.1–1.0)
Monocytes Relative: 7 %
NEUTROS ABS: 4.6 10*3/uL (ref 1.7–7.7)
Neutrophils Relative %: 66 %
PLATELETS: 307 10*3/uL (ref 150–400)
RBC: 2.7 MIL/uL — AB (ref 3.87–5.11)
RDW: 13.2 % (ref 11.5–15.5)
WBC: 7.1 10*3/uL (ref 4.0–10.5)

## 2017-09-21 LAB — LIPASE, BLOOD: Lipase: 17 U/L (ref 11–51)

## 2017-09-21 LAB — SEDIMENTATION RATE: SED RATE: 13 mm/h (ref 0–22)

## 2017-09-21 LAB — MAGNESIUM: MAGNESIUM: 1.4 mg/dL — AB (ref 1.7–2.4)

## 2017-09-21 MED ORDER — TRIAMCINOLONE 0.1 % CREAM:EUCERIN CREAM 1:1
TOPICAL_CREAM | Freq: Two times a day (BID) | CUTANEOUS | Status: DC
  Administered 2017-09-22: 01:00:00 via TOPICAL
  Filled 2017-09-21: qty 1

## 2017-09-21 MED ORDER — POTASSIUM CHLORIDE CRYS ER 20 MEQ PO TBCR
20.0000 meq | EXTENDED_RELEASE_TABLET | Freq: Once | ORAL | Status: AC
  Administered 2017-09-21: 20 meq via ORAL
  Filled 2017-09-21: qty 1

## 2017-09-21 MED ORDER — CEFTRIAXONE SODIUM 1 G IJ SOLR
1.0000 g | Freq: Once | INTRAMUSCULAR | Status: AC
  Administered 2017-09-21: 1 g via INTRAVENOUS
  Filled 2017-09-21: qty 10

## 2017-09-21 MED ORDER — MAGNESIUM SULFATE 2 GM/50ML IV SOLN
2.0000 g | Freq: Once | INTRAVENOUS | Status: AC
  Administered 2017-09-21: 2 g via INTRAVENOUS
  Filled 2017-09-21: qty 50

## 2017-09-21 MED ORDER — OXYCODONE HCL 5 MG PO TABS
10.0000 mg | ORAL_TABLET | Freq: Once | ORAL | Status: AC
  Administered 2017-09-21: 10 mg via ORAL
  Filled 2017-09-21: qty 2

## 2017-09-21 MED ORDER — DIPHENHYDRAMINE HCL 25 MG PO CAPS
25.0000 mg | ORAL_CAPSULE | Freq: Once | ORAL | Status: AC
  Administered 2017-09-21: 25 mg via ORAL
  Filled 2017-09-21: qty 1

## 2017-09-21 MED ORDER — POTASSIUM CHLORIDE 10 MEQ/100ML IV SOLN
10.0000 meq | INTRAVENOUS | Status: AC
  Administered 2017-09-21 (×2): 10 meq via INTRAVENOUS
  Filled 2017-09-21 (×2): qty 100

## 2017-09-21 MED ORDER — POTASSIUM CHLORIDE 10 MEQ/100ML IV SOLN: 10.0000 meq | INTRAVENOUS | Status: DC

## 2017-09-21 MED ORDER — DOXYCYCLINE HYCLATE 100 MG PO TABS
100.0000 mg | ORAL_TABLET | Freq: Two times a day (BID) | ORAL | Status: DC
  Administered 2017-09-21 – 2017-09-22 (×2): 100 mg via ORAL
  Filled 2017-09-21 (×2): qty 1

## 2017-09-21 NOTE — ED Notes (Signed)
CRITICAL VALUE ALERT  Critical Value:  Potassium 2.7  Date & Time Notied:  09/21/17 2203  Provider Notified: EDP Charm Barges   Orders Received/Actions taken: No verbal orders at this time
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
## 2017-09-21 NOTE — ED Triage Notes (Signed)
Pt c/o rash to body x one month. She was seen by Dr. Selena Batten today and was told to come to ER to be admitted.
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
## 2017-09-22 ENCOUNTER — Encounter (HOSPITAL_COMMUNITY): Payer: Self-pay | Admitting: *Deleted

## 2017-09-22 DIAGNOSIS — R21 Rash and other nonspecific skin eruption: Secondary | ICD-10-CM

## 2017-09-22 DIAGNOSIS — K8689 Other specified diseases of pancreas: Secondary | ICD-10-CM

## 2017-09-22 LAB — CBC
HEMATOCRIT: 29.6 % — AB (ref 36.0–46.0)
Hemoglobin: 10.1 g/dL — ABNORMAL LOW (ref 12.0–15.0)
MCH: 33.6 pg (ref 26.0–34.0)
MCHC: 34.1 g/dL (ref 30.0–36.0)
MCV: 98.3 fL (ref 78.0–100.0)
PLATELETS: 375 10*3/uL (ref 150–400)
RBC: 3.01 MIL/uL — ABNORMAL LOW (ref 3.87–5.11)
RDW: 13.1 % (ref 11.5–15.5)
WBC: 8.5 10*3/uL (ref 4.0–10.5)

## 2017-09-22 LAB — COMPREHENSIVE METABOLIC PANEL
ALT: 28 U/L (ref 14–54)
AST: 40 U/L (ref 15–41)
Albumin: 2.1 g/dL — ABNORMAL LOW (ref 3.5–5.0)
Alkaline Phosphatase: 193 U/L — ABNORMAL HIGH (ref 38–126)
Anion gap: 8 (ref 5–15)
BILIRUBIN TOTAL: 0.8 mg/dL (ref 0.3–1.2)
BUN: 7 mg/dL (ref 6–20)
CHLORIDE: 101 mmol/L (ref 101–111)
CO2: 28 mmol/L (ref 22–32)
CREATININE: 0.62 mg/dL (ref 0.44–1.00)
Calcium: 8.3 mg/dL — ABNORMAL LOW (ref 8.9–10.3)
GFR calc Af Amer: >60 mL/min (ref >60)
Glucose, Bld: 117 mg/dL — ABNORMAL HIGH (ref 65–99)
Potassium: 3.4 mmol/L — ABNORMAL LOW (ref 3.5–5.1)
Sodium: 137 mmol/L (ref 135–145)
TOTAL PROTEIN: 5.7 g/dL — AB (ref 6.5–8.1)

## 2017-09-22 LAB — URINALYSIS, ROUTINE W REFLEX MICROSCOPIC
Bilirubin Urine: NEGATIVE
Glucose, UA: NEGATIVE mg/dL
KETONES UR: NEGATIVE mg/dL
Leukocytes, UA: NEGATIVE
Nitrite: POSITIVE — AB
PH: 6 (ref 5.0–8.0)
Protein, ur: NEGATIVE mg/dL
RBC / HPF: >50 RBC/hpf — ABNORMAL HIGH (ref 0–5)
SPECIFIC GRAVITY, URINE: 1.02 (ref 1.005–1.030)

## 2017-09-22 LAB — PROTIME-INR
INR: 1.05
Prothrombin Time: 13.6 seconds (ref 11.4–15.2)

## 2017-09-22 LAB — MAGNESIUM: Magnesium: 2.1 mg/dL (ref 1.7–2.4)

## 2017-09-22 MED ORDER — HYDROCERIN EX CREA
TOPICAL_CREAM | Freq: Two times a day (BID) | CUTANEOUS | Status: DC
  Administered 2017-09-22 – 2017-09-25 (×7): via TOPICAL
  Filled 2017-09-22: qty 113

## 2017-09-22 MED ORDER — POTASSIUM CHLORIDE 10 MEQ/100ML IV SOLN
10.0000 meq | INTRAVENOUS | Status: DC
  Administered 2017-09-22: 10 meq via INTRAVENOUS
  Filled 2017-09-22: qty 100

## 2017-09-22 MED ORDER — POTASSIUM CHLORIDE CRYS ER 20 MEQ PO TBCR
40.0000 meq | EXTENDED_RELEASE_TABLET | Freq: Once | ORAL | Status: AC
  Administered 2017-09-22: 40 meq via ORAL
  Filled 2017-09-22: qty 2

## 2017-09-22 MED ORDER — SODIUM CHLORIDE 0.9 % IV SOLN
INTRAVENOUS | Status: DC
  Administered 2017-09-22: 08:00:00 via INTRAVENOUS

## 2017-09-22 MED ORDER — SODIUM CHLORIDE 0.9 % IV SOLN
1.0000 g | INTRAVENOUS | Status: AC
  Administered 2017-09-22 – 2017-09-24 (×3): 1 g via INTRAVENOUS
  Filled 2017-09-22 (×3): qty 1

## 2017-09-22 MED ORDER — PROCHLORPERAZINE MALEATE 5 MG PO TABS
5.0000 mg | ORAL_TABLET | Freq: Three times a day (TID) | ORAL | Status: AC | PRN
  Administered 2017-09-22 – 2017-09-25 (×4): 5 mg via ORAL
  Filled 2017-09-22 (×4): qty 1

## 2017-09-22 MED ORDER — ENOXAPARIN SODIUM 40 MG/0.4ML ~~LOC~~ SOLN
40.0000 mg | SUBCUTANEOUS | Status: DC
  Filled 2017-09-22 (×3): qty 0.4

## 2017-09-22 MED ORDER — ADULT MULTIVITAMIN W/MINERALS CH
1.0000 | ORAL_TABLET | Freq: Every day | ORAL | Status: DC
  Administered 2017-09-22 – 2017-09-25 (×4): 1 via ORAL
  Filled 2017-09-22 (×4): qty 1

## 2017-09-22 MED ORDER — DEKAS PLUS NICU ORAL LIQUID: 1.0000 mL | Freq: Every morning | ORAL | Status: DC

## 2017-09-22 MED ORDER — HYDRALAZINE HCL 25 MG PO TABS: 25.0000 mg | ORAL_TABLET | Freq: Four times a day (QID) | ORAL | Status: DC | PRN

## 2017-09-22 MED ORDER — QUETIAPINE FUMARATE 25 MG PO TABS: 25.0000 mg | ORAL_TABLET | Freq: Every evening | ORAL | Status: DC | PRN

## 2017-09-22 MED ORDER — TRIAMCINOLONE ACETONIDE 0.1 % EX CREA
TOPICAL_CREAM | Freq: Two times a day (BID) | CUTANEOUS | Status: DC
  Administered 2017-09-22 – 2017-09-25 (×7): via TOPICAL
  Filled 2017-09-22 (×2): qty 15

## 2017-09-22 MED ORDER — NICOTINE 21 MG/24HR TD PT24
21.0000 mg | MEDICATED_PATCH | Freq: Every day | TRANSDERMAL | Status: DC
  Administered 2017-09-22 – 2017-09-25 (×5): 21 mg via TRANSDERMAL
  Filled 2017-09-22 (×5): qty 1

## 2017-09-22 MED ORDER — OXYCODONE-ACETAMINOPHEN 7.5-325 MG PO TABS
1.0000 | ORAL_TABLET | Freq: Four times a day (QID) | ORAL | Status: DC | PRN
  Administered 2017-09-22 – 2017-09-25 (×11): 1 via ORAL
  Filled 2017-09-22 (×12): qty 1

## 2017-09-22 MED ORDER — ALUM & MAG HYDROXIDE-SIMETH 200-200-20 MG/5ML PO SUSP
30.0000 mL | ORAL | Status: DC | PRN
Start: 2017-09-22 — End: 2017-09-25
  Administered 2017-09-22 – 2017-09-25 (×9): 30 mL via ORAL
  Filled 2017-09-22 (×9): qty 30

## 2017-09-22 MED ORDER — ALPRAZOLAM 1 MG PO TABS
1.0000 mg | ORAL_TABLET | Freq: Three times a day (TID) | ORAL | Status: DC | PRN
  Administered 2017-09-22 – 2017-09-25 (×10): 1 mg via ORAL
  Filled 2017-09-22 (×10): qty 1

## 2017-09-22 MED ORDER — HYDROXYZINE HCL 25 MG PO TABS
25.0000 mg | ORAL_TABLET | Freq: Three times a day (TID) | ORAL | Status: DC | PRN
  Administered 2017-09-22 – 2017-09-25 (×10): 25 mg via ORAL
  Filled 2017-09-22 (×10): qty 1

## 2017-09-22 MED ORDER — QUETIAPINE FUMARATE 25 MG PO TABS
25.0000 mg | ORAL_TABLET | Freq: Every evening | ORAL | Status: DC | PRN
  Administered 2017-09-22 – 2017-09-24 (×4): 25 mg via ORAL
  Filled 2017-09-22 (×4): qty 1

## 2017-09-22 MED ORDER — VENLAFAXINE HCL ER 75 MG PO CP24
75.0000 mg | ORAL_CAPSULE | Freq: Every day | ORAL | Status: DC
  Administered 2017-09-22 – 2017-09-25 (×4): 75 mg via ORAL
  Filled 2017-09-22 (×4): qty 1

## 2017-09-22 NOTE — Progress Notes (Signed)
EKG completed and given to [place], [place]

## 2017-09-22 NOTE — Progress Notes (Signed)
Spoke with RN regarding Seroquel, Effexor, and vistaril.  MD aware of EKG and Ok to verify these orders for patient to receive.   Kendra Cox [phone]

## 2017-09-22 NOTE — Consult Note (Signed)
Referring Provider: Dr. Irwin Brakeman  Primary Care Physician:  Jani Gravel, MD Primary Gastroenterologist:  [facility] ([facility] in [place], affiliated with [facility]). Wants to establish care with [facility] due to transportation.     Date of Admission: 09/21/17 Date of Consultation: 09/22/17  Reason for Consultation:  Severe malnutrition, ?intolerance to Creon  HPI:  Kendra Cox is a 52 y.o. year old female who was last seen while inpatient Jan 2019 for weight loss, abdominal pain, and anemia. She had previously been evaluated by Dr. Earlean Shawl in Sept 2018 with plans for colonoscopy/EGD. She was never able to complete this due to lack of insurance. 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 High of 160 last year per patient, 123 in Jan 2019, and now 105. Admitting Hgb in Jan 2019 was 8.7, CT with long segment colitis extending from cecum to mid descending colon, worst in proximal colon, hepatic steatosis. EGD was completed during Jan 2019 at [facility] due to N/V, dysphagia. She was to have a colonoscopy as outpatient. Empiric dilation performed due to possible proximal esophageal web. Gastritis noted, normal duodenum. Biopsies benign for abnormal villi. Negative H.pylori. Cortisol Jan 2019 mildly low at 6.4. Consideration for serologies for PBC and possible liver biopsy recommended when returning to see Dr. Earlean Shawl. GGT elevated.   Admitted yesterday with hgb 9.2 (near baseline). Severe hypokalemia, hypoalbuminemia, elevated alk phos. She has had fluctuating transaminases in the past with elevated alk phos. She had reported a worsening rash to her PCP over past few weeks, worsening. She was given creams for this. There was concern it was related to Creon per admitting notes. Possible pneumonia on admission. She tells me that 2 days after starting Creon, she developed a  diffuse rash over her entire body. Notes itching diffusely. She ran out of Creon for one week but still had the rash. She has continued Creon for 6 weeks. She noted significant improvement in her bowel habits with this. She notes lower extremity pedal edema, "weeping" from her feet. Notes she has had intermittent N/V especially related to anxiety and psychosocial issues with her daughter; however, she feels this is improved. Appetite has been poor with stress but improving. She has been drinking alcohol approximately once per week now. Last dose of Creon was yesterday.    Hep B surface antibody, Hep B core antibody, Hep C antibody, and Hep A antibody all non-reactive in Sept 2018. Chronic diarrhea with pancreatic elastase done in [place] noting severe pancreatic insufficiency.   She originally had told me that her brother had been diagnosed with colon cancer at age 72; however, now she states it was her maternal uncle. I clarified this with her several times today. No prior colonoscopy.   Past Medical History:  Diagnosis Date  . Asthma   . Depression   . Fibromyalgia   . Hypertension   . Pancreatitis   . PTSD (post-traumatic stress disorder)     Past Surgical History:  Procedure Laterality Date  . ABDOMINAL HYSTERECTOMY    . BIOPSY  06/16/2017   Procedure: BIOPSY;  Surgeon: Danie Binder, MD;  Location[facility];  Service: Endoscopy;;  . BREAST SURGERY    . ESOPHAGOGASTRODUODENOSCOPY N/A 06/16/2017   Empiric dilation performed due to possible proximal esophageal web. Gastritis noted, normal duodenum. Biopsies benign for abnormal villi. Negative H.pylori.  Marland Kitchen SAVORY DILATION  06/16/2017   Procedure: SAVORY  DILATION;  Surgeon: Danie Binder, MD;  Location: [facility];  Service: Endoscopy;;  . thumb surgery      Prior to Admission medications   Medication Sig Start Date End Date Taking? Authorizing Provider  albuterol (PROVENTIL HFA;VENTOLIN HFA) 108 (90 Base) MCG/ACT inhaler  Inhale 1-2 puffs into the lungs every 6 (six) hours as needed for wheezing or shortness of breath. 08/06/15  Yes Caryl Ada K, PA-C  ALPRAZolam Duanne Moron) 1 MG tablet Take 1 mg by mouth 3 (three) times daily as needed for sleep or anxiety.    Yes [provider]  clotrimazole-betamethasone (LOTRISONE) cream APPLY TOPICALLY TO RASH TWO TIMES DAILY 09/08/17  Yes [provider]  cyclobenzaprine (FLEXERIL) 10 MG tablet Take 10 mg by mouth 3 (three) times daily as needed for muscle spasms.   Yes [provider]  fluconazole (DIFLUCAN) 150 MG tablet TAKE 1 TABLET BY MOUTH EVERY 3 DAYS 09/07/17  Yes [provider]  hydrOXYzine (ATARAX/VISTARIL) 25 MG tablet TAKE 1 TABLET BY MOUTH EVERY 6 TO 8 HOURS AS NEEDED FOR ITCHING 09/17/17  Yes [provider]  lipase/protease/amylase (CREON) 36000 UNITS CPEP capsule Take 4 capsules (144,000 Units total) by mouth 3 (three) times daily with meals. 06/17/17  Yes Tat, Shanon Brow, MD  lisinopril-hydrochlorothiazide (PRINZIDE,ZESTORETIC) 20-12.5 MG tablet Take 1 tablet by mouth daily.   Yes [provider]  oxyCODONE-acetaminophen (PERCOCET) 7.5-325 MG tablet Take 1 tablet by mouth 4 (four) times daily.    Yes [provider]  pantoprazole (PROTONIX) 40 MG tablet Take 1 tablet (40 mg total) by mouth daily. 06/18/17  Yes Tat, Shanon Brow, MD  promethazine (PHENERGAN) 25 MG tablet take ONE-HALF tablet BY MOUTH TWICE DAILY AS NEEDED FOR nausea/vomiting 05/27/17  Yes [provider]  QUEtiapine (SEROQUEL) 25 MG tablet TAKE ONE TABLET BY MOUTH AT BEDTIME AS NEEDED FOR SLEEP 05/27/17  Yes [provider]  rOPINIRole (REQUIP) 0.5 MG tablet Take 1 tablet by mouth at bedtime. 06/30/17  Yes [provider]  SYMBICORT 160-4.5 MCG/ACT inhaler INHALE TWO PUFFS INTO LUNGS TWICE DAILY 08/18/17  Yes [provider]  venlafaxine XR (EFFEXOR-XR) 150 MG 24 hr capsule Take 150 mg by mouth every morning.   Yes [provider]  venlafaxine XR (EFFEXOR-XR) 75 MG 24 hr capsule TAKE ONE CAPSULE BY MOUTH EVERY DAY FOR DEPRESSION 05/27/17  Yes [provider]  chlordiazePOXIDE (LIBRIUM) 25 MG capsule 46m PO TID x 1D, then 25-54mPO BID X 1D, then 25-509mO QD X 1D  For assistance with alcohol withdrawal symptoms Patient not taking: Reported on 09/21/2017 06/22/17   WenDaleen BoD    Current Facility-Administered Medications  Medication Dose Route Frequency Provider Last Rate Last Dose  . 0.9 %  sodium chloride infusion   Intravenous Continuous LamOswald HillockD 10 mL/hr at 09/22/17 0805    . ALPRAZolam (XADuanne Moronablet 1 mg  1 mg Oral TID PRN LamOswald HillockD   1 mg at 09/22/17 1242  . cefTRIAXone (ROCEPHIN) 1 g in sodium chloride 0.9 % 100 mL IVPB  1 g Intravenous Q24H Johnson, Clanford L, MD      . doxycycline (VIBRA-TABS) tablet 100 mg  100 mg Oral Q12H LamOswald HillockD   100 mg at 09/22/17 0939  . enoxaparin (LOVENOX) injection 40 mg  40 mg Subcutaneous Q24H LamIraqagan S, MD      . hydrALAZINE (APRESOLINE) tablet 25 mg  25 mg Oral Q6H PRN LamEleonore Chiquito  S, MD      . triamcinolone cream (KENALOG) 0.1 %   Topical BID Johnson, Clanford L, MD       And  . hydrocerin (EUCERIN) cream   Topical BID Johnson, Clanford L, MD      . hydrOXYzine (ATARAX/VISTARIL) tablet 25 mg  25 mg Oral TID PRN Oswald Hillock, MD   25 mg at 09/22/17 0939  . nicotine (NICODERM CQ - dosed in mg/24 hours) patch 21 mg  21 mg Transdermal Daily Oswald Hillock, MD   21 mg at 09/22/17 0944  . oxyCODONE-acetaminophen (PERCOCET) 7.5-325 MG per tablet 1 tablet  1 tablet Oral Q6H PRN Oswald Hillock, MD   1 tablet at 09/22/17 0948  . QUEtiapine (SEROQUEL) tablet 25 mg  25 mg Oral QHS PRN Oswald Hillock, MD   25 mg at 09/22/17 0414  . venlafaxine XR (EFFEXOR-XR) 24 hr capsule 75 mg  75 mg Oral Q breakfast Oswald Hillock, MD   75 mg at 09/22/17 0815    Allergies as of 09/21/2017 - Review Complete 09/21/2017  Allergen Reaction Noted  .  Sulfa antibiotics Anaphylaxis 10/22/2012    Family History  Problem Relation Age of Onset  . Colon cancer Maternal Uncle 35    Social History   Socioeconomic History  . Marital status: Divorced    Spouse name: Not on file  . Number of children: Not on file  . Years of education: Not on file  . Highest education level: Not on file  Occupational History  . Not on file  Social Needs  . Financial resource strain: Not on file  . Food insecurity:    Worry: Not on file    Inability: Not on file  . Transportation needs:    Medical: Not on file    Non-medical: Not on file  Tobacco Use  . Smoking status: Current Every Day Smoker    Packs/day: 1.00    Types: Cigarettes  . Smokeless tobacco: Never Used  Substance and Sexual Activity  . Alcohol use: Not Currently    Comment: yesterday  . Drug use: Yes    Types: Oxycodone    Comment: per family report  . Sexual activity: Never    Birth control/protection: Surgical  Lifestyle  . Physical activity:    Days per week: Not on file    Minutes per session: Not on file  . Stress: Not on file  Relationships  . Social connections:    Talks on phone: Not on file    Gets together: Not on file    Attends religious service: Not on file    Active member of club or organization: Not on file    Attends meetings of clubs or organizations: Not on file    Relationship status: Not on file  . Intimate partner violence:    Fear of current or ex partner: Not on file    Emotionally abused: Not on file    Physically abused: Not on file    Forced sexual activity: Not on file  Other Topics Concern  . Not on file  Social History Narrative  . Not on file    Review of Systems: Gen: see HPI  CV: Denies chest pain, heart palpitations, syncope, edema  Resp: Denies shortness of breath with rest, cough, wheezing GI: see HPI  GU : Denies urinary burning, urinary frequency, urinary incontinence.  MS: see HPI  Derm: Denies rash, itching, dry  skin Psych: Denies depression, anxiety,confusion, or  memory loss Heme: Denies bruising, bleeding, and enlarged lymph nodes.  Physical Exam: Vital signs in last 24 hours: Temp:  [98.1 F (36.7 C)-98.4 F (36.9 C)] 98.1 F (36.7 C) (05/07 1323) Pulse Rate:  [87-103] 90 (05/07 1323) Resp:  [10-39] 22 (05/07 1323) BP: (126-168)/(102-112) 155/109 (05/07 1323) SpO2:  [95 %-100 %] 98 % (05/07 1323) Weight:  [105 lb 13.1 oz (48 kg)-106 lb (48.1 kg)] 105 lb 13.1 oz (48 kg) (05/07 0059) Last BM Date: 09/21/17 General:   Alert,  Well-developed, well-nourished, pleasant and cooperative in NAD Head:  Normocephalic and atraumatic. Eyes:  Sclera clear, no icterus.   Conjunctiva pink. Ears:  Normal auditory acuity. Nose:  No deformity, discharge,  or lesions. Mouth:  No deformity or lesions, dentition normal. Lungs:  Clear throughout to auscultation.    Heart:  S1 S2 present without murmurs  Abdomen:  Soft, nontender and nondistended. No masses, hepatosplenomegaly or hernias noted. Normal bowel sounds, without guarding, and without rebound.   Rectal:  Deferred until time of colonoscopy.   Msk:  Symmetrical without gross deformities. Extremities:  With 1+ pretibial edema, ankles and feet wrapped in ace wrap, notable pedal edema Neurologic:  Alert and  oriented x4;  grossly normal neurologically. Psych:  Alert and cooperative. Normal mood and affect.  Intake/Output from previous day: 05/06 0701 - 05/07 0700 In: 200 [IV Piggyback:200] Out: 300 [Urine:300] Intake/Output this shift: Total I/O In: 120 [P.O.:120] Out: -   Lab Results: Recent Labs    09/21/17 2102 09/22/17 0513  WBC 7.1 8.5  HGB 9.2* 10.1*  HCT 26.8* 29.6*  PLT 307 375   BMET Recent Labs    09/21/17 2102 09/22/17 0513  NA 135 137  K 2.7* 3.4*  CL 99* 101  CO2 28 28  GLUCOSE 107* 117*  BUN 8 7  CREATININE 0.56 0.62  CALCIUM 7.9* 8.3*   LFT Recent Labs    09/21/17 2102 09/22/17 0513  PROT 5.2* 5.7*   ALBUMIN 1.8* 2.1*  AST 40 40  ALT 25 28  ALKPHOS 179* 193*  BILITOT 1.1 0.8    Studies/Results: Dg Chest 2 View  Result Date: 09/21/2017 CLINICAL DATA:  Fever. BILATERAL hand and foot skin changes with erythema and blisters. EXAM: CHEST - 2 VIEW COMPARISON:  06/14/2017, 12/25/2015, 12/07/2011 and CTA chest 12/25/2015. FINDINGS: AP ERECT and LATERAL images were obtained. Cardiomediastinal silhouette unremarkable, unchanged. Hyperinflation with emphysematous changes in both lungs, unchanged. New focal airspace opacity in the RIGHT lower lobe, projecting immediately adjacent to the nipple shadow on the AP image. Lungs otherwise clear. No pleural effusions. No pneumothorax. Visualized bony thorax intact. Vascular embolization coils in the LEFT UPPER quadrant of the abdomen IMPRESSION: Focal pneumonia involving the RIGHT lower lobe, superimposed upon COPD/emphysema. Electronically Signed   By: Evangeline Dakin M.D.   On: 09/21/2017 21:08    Impression: 52 year old female with history of chronic alcohol abuse, severe pancreatic insufficiency, chronic abdominal pain, N/V, and weight loss, presenting to the ED with diffuse pruritis and rash. Concern has been raised that this is a reaction to Creon, as she states rash onset was 2 days after starting Creon. She also notes she was without this medication for one week but symptoms continued. Last dose yesterday. I discussed with pharmacy, and this would be rare with Creon. It is unclear if this is a true medication reaction. Unfortunately, we do not have Zenpep on formulary here in the hospital but would recommend trial with this.  N/V: chronic, worsened  with stress. EGD completed during last admission Jan 2019 with empiric dilation of possible proximal esophageal web, gastritis noted, normal duodenum, no evidence of abnormal villi. Dysphagia resolved. N/V improving.   Abdominal pain: at baseline. No pain noted with eating. Gastritis on EGD. Does not have  symptoms class for chronic mesenteric ischemia but does have notable weight loss. Consider CTA if any concern.  Diarrhea: improved as outpatient on Creon, now with Creon discontinued due to possible side effect. Would trial Zenpep but not on formulary here. Currently without diarrhea.   Abnormal LFTs: in setting of chronic ETOH abuse, fatty liver, Hep B surface antibody, Hep B core antibody, Hep C antibody, and Hep A antibody all negative in Sept 2018. Mixed pattern elevation, with fluctuating transaminases and elevated alk phos. Bilirubin now normal, alk phos remains elevated. GGT elevated during last admission. Will check full serologies including AMA to assess for ?PBC especially in light of her presentation. May ultimately need liver biopsy.   Anemia: history of IDA. Multifactorial in setting of chronic ETOH abuse, malnutrition. Iron studies from Jan 2019 with elevated ferritin but this is acute phase reactant. No overt GI bleeding. EGD on file. Needs colonoscopy in near future once acute illness resolved. Hgb stable for now.     Plan: Hold Creon for now until further discussion with Dr. Oneida Alar. May be able to trial Zenpep, but this is not on formulary here.  Add Protonix BID  Extensive serologies to include AMA, may ultimately need liver biopsy as outpatient Nutrition consult CTA if abdominal pain noted with eating: weight loss likely more related to decreased oral intake, malabsorption Will ultimately need colonoscopy as outpatient Will continue to follow with you   Annitta Needs, PhD, ANP-BC [facility]    LOS: 1 day    09/22/2017, 1:33 PM
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
## 2017-09-22 NOTE — Progress Notes (Signed)
Initial Nutrition Assessment  DOCUMENTATION CODES:   Severe malnutrition in context of chronic illness  INTERVENTION:  Boost High Protein BID between meals (chocolate)  Greek yogurt with breakfast daily  Prefers boiled eggs instead of scrambled    NUTRITION DIAGNOSIS:   Severe Malnutrition related to chronic illness(pancreatic insufficency, vomiting associated with intake of food, limiited food intake pattern) as evidenced by percent weight loss 18%, and energy intake </= 75% for >/= 1 month.   GOAL: Patient will meet greater than or equal to 90% of their needs   MONITOR:  PO intake, Supplement acceptance, Labs, Weight trends    REASON FOR ASSESSMENT:  Consult Assessment of nutrition requirement/status  ASSESSMENT:  The patient is a 52 yo female who presents with possible pneumonia and a rash after taking Creon for a few days. She complains of pain when trying to walk due to the rash on her feet. She has hx of chronic daily ETOH abuse, pancreatic exocrine insufficency. She was hospitalized in late January with c/o lower extremity edema, significant wt loss, chronic abdominal pain. She re-presented to ED on February with emesis ETOH induced pancreatitis.   The patient lives with her mom and dad and says her son lives in her house next door. The mother has been preparing food for her though her appetite has been very poor the past 2 weeks.  She complains of vomiting at times with food ingestion and limits high fat food choices because of associated pain. The patient reports it is not uncommon for her to go 2-3 days and not eat. She will feel hunger pains and try to eat but then vomit.  Patient weight hx shows abnormal weight loss: severe weight decrease of 18% x 3 months. Patient reported previously reported to RD 06/15/17 that usual weight range between 120-130 lb. Her weight was 123.4 lb 06/15/17 and 129 lb on 06/22/17.    Labs: BMP Latest Ref Rng & Units 09/22/2017 09/21/2017 06/22/2017   Glucose 65 - 99 mg/dL 161(W) 960(A) 90  BUN 6 - 20 mg/dL 7 8 5(L)  Creatinine 5.40 - 1.00 mg/dL 9.81 1.91 4.78  Sodium 135 - 145 mmol/L 137 135 130(L)  Potassium 3.5 - 5.1 mmol/L 3.4(L) 2.7(LL) 3.9  Chloride 101 - 111 mmol/L 101 99(L) 94(L)  CO2 22 - 32 mmol/L Calcium 8.9 - 10.3 mg/dL 8.3(L) 7.9(L) 8.2(L)    Scheduled meds: . enoxaparin (LOVENOX) injection  40 mg Subcutaneous Q24H  . triamcinolone cream   Topical BID   And  . hydrocerin   Topical BID  . multivitamin with minerals  1 tablet Oral Daily  . nicotine  21 mg Transdermal Daily  . venlafaxine XR  75 mg Oral Q breakfast    NUTRITION - FOCUSED PHYSICAL EXAM:    Most Recent Value  Orbital Region  Mild depletion  Upper Arm Region  Moderate depletion  Thoracic and Lumbar Region  Moderate depletion  Buccal Region  Mild depletion  Temple Region  Mild depletion  Clavicle Bone Region  Mild depletion  Clavicle and Acromion Bone Region  Moderate depletion  Scapular Bone Region  Unable to assess  Dorsal Hand  Mild depletion  Patellar Region  No depletion  Anterior Thigh Region  Mild depletion  Posterior Calf Region  Mild depletion  Edema (RD Assessment)  Mild [bilateral feet]  Hair  Reviewed  Eyes  Reviewed  Mouth  Reviewed  Skin  Reviewed [rash]  Nails  Reviewed  Diet Order:   Diet Order           Diet Heart Room service appropriate? Yes; Fluid consistency: Thin  Diet effective now          EDUCATION NEEDS:   Education needs have been addressed(reveiwed protein food sources) Skin:  Skin Assessment: Skin Integrity Issues: Skin Integrity Issues:: (Rash particularly on left foot -wrapped in ace bandage)  Last BM:  5/6   Height:   Ht Readings from Last 1 Encounters:  09/22/17  (1.575 m)    Weight:   Wt Readings from Last 1 Encounters:  09/22/17 105 lb 13.1 oz (48 kg)    Ideal Body Weight:  50 kg  BMI:  Body mass index is 19.35 kg/m.  Estimated Nutritional Needs:   Kcal:   4098-1191 (35-38 kcal/kg/bw)  Protein:  72-84 gr (1.5-1.7 gr/kg/bw)  Fluid:  >1400 ml daily   Royann Shivers MS,RD,CSG,LDN Office: [phone] Pager: [phone]

## 2017-09-23 ENCOUNTER — Inpatient Hospital Stay (HOSPITAL_COMMUNITY): Payer: Self-pay

## 2017-09-23 DIAGNOSIS — R197 Diarrhea, unspecified: Secondary | ICD-10-CM

## 2017-09-23 DIAGNOSIS — R1010 Upper abdominal pain, unspecified: Secondary | ICD-10-CM

## 2017-09-23 DIAGNOSIS — R634 Abnormal weight loss: Secondary | ICD-10-CM

## 2017-09-23 DIAGNOSIS — E43 Unspecified severe protein-calorie malnutrition: Secondary | ICD-10-CM

## 2017-09-23 DIAGNOSIS — J189 Pneumonia, unspecified organism: Secondary | ICD-10-CM

## 2017-09-23 LAB — BASIC METABOLIC PANEL
ANION GAP: 8 (ref 5–15)
BUN: 7 mg/dL (ref 6–20)
CHLORIDE: 100 mmol/L — AB (ref 101–111)
CO2: 27 mmol/L (ref 22–32)
Calcium: 7.8 mg/dL — ABNORMAL LOW (ref 8.9–10.3)
Creatinine, Ser: 0.55 mg/dL (ref 0.44–1.00)
GFR calc Af Amer: >60 mL/min (ref >60)
GFR calc non Af Amer: >60 mL/min (ref >60)
Glucose, Bld: 110 mg/dL — ABNORMAL HIGH (ref 65–99)
POTASSIUM: 3.5 mmol/L (ref 3.5–5.1)
SODIUM: 135 mmol/L (ref 135–145)

## 2017-09-23 LAB — CBC
HEMATOCRIT: 28.8 % — AB (ref 36.0–46.0)
Hemoglobin: 9.8 g/dL — ABNORMAL LOW (ref 12.0–15.0)
MCH: 34 pg (ref 26.0–34.0)
MCHC: 34 g/dL (ref 30.0–36.0)
MCV: 100 fL (ref 78.0–100.0)
Platelets: 289 10*3/uL (ref 150–400)
RBC: 2.88 MIL/uL — ABNORMAL LOW (ref 3.87–5.11)
RDW: 13.6 % (ref 11.5–15.5)
WBC: 8.9 10*3/uL (ref 4.0–10.5)

## 2017-09-23 LAB — IGG, IGA, IGM
IGA: 517 mg/dL — AB (ref 87–352)
IgG (Immunoglobin G), Serum: 1054 mg/dL (ref 700–1600)
IgM (Immunoglobulin M), Srm: 124 mg/dL (ref 26–217)

## 2017-09-23 LAB — IRON AND TIBC
Iron: 43 ug/dL (ref 28–170)
Saturation Ratios: 35 % — ABNORMAL HIGH (ref 10.4–31.8)
TIBC: 123 ug/dL — ABNORMAL LOW (ref 250–450)
UIBC: 80 ug/dL

## 2017-09-23 LAB — ANTINUCLEAR ANTIBODIES, IFA: ANTINUCLEAR ANTIBODIES, IFA: NEGATIVE

## 2017-09-23 LAB — CERULOPLASMIN: Ceruloplasmin: 16.9 mg/dL — ABNORMAL LOW (ref 19.0–39.0)

## 2017-09-23 LAB — HIV ANTIBODY (ROUTINE TESTING W REFLEX): HIV SCREEN 4TH GENERATION: NONREACTIVE

## 2017-09-23 LAB — ANTI-SMOOTH MUSCLE ANTIBODY, IGG: F-Actin IgG: 7 Units (ref 0–19)

## 2017-09-23 LAB — FERRITIN: Ferritin: 333 ng/mL — ABNORMAL HIGH (ref 11–307)

## 2017-09-23 LAB — MITOCHONDRIAL ANTIBODIES

## 2017-09-23 LAB — RPR: RPR Ser Ql: NONREACTIVE

## 2017-09-23 MED ORDER — AZITHROMYCIN 250 MG PO TABS: 250.0000 mg | ORAL_TABLET | Freq: Every day | ORAL | Status: DC

## 2017-09-23 MED ORDER — ROPINIROLE HCL 0.25 MG PO TABS
0.5000 mg | ORAL_TABLET | Freq: Every day | ORAL | Status: DC
  Administered 2017-09-23 – 2017-09-24 (×2): 0.5 mg via ORAL
  Filled 2017-09-23 (×2): qty 2

## 2017-09-23 MED ORDER — IOPAMIDOL (ISOVUE-370) INJECTION 76%
100.0000 mL | Freq: Once | INTRAVENOUS | Status: AC | PRN
  Administered 2017-09-23: 100 mL via INTRAVENOUS

## 2017-09-23 NOTE — Progress Notes (Signed)
Pharmacy Antibiotic Note  Kendra Cox is a 52 y.o. female admitted on 09/21/2017 with pneumonia.  Pharmacy has been consulted for ROCEPHIN dosing.  Plan: Rocephin 1gm IV q24hrs Monitor labs, progress, c/s  Height:  (157.5 cm) Weight: 105 lb 13.1 oz (48 kg) IBW/kg (Calculated) : 50.1  Temp (24hrs), Avg:98.5 F (36.9 C), Min:98.1 F (36.7 C), Max:99 F (37.2 C)  Recent Labs  Lab 09/21/17 2102 09/22/17 0513 09/23/17 0826 09/23/17 0827  WBC 7.1 8.5  --  8.9  CREATININE 0.56 0.62 0.55  --     Estimated Creatinine Clearance: 62.3 mL/min (by C-G formula based on SCr of 0.55 mg/dL).    Allergies  Allergen Reactions  . Sulfa Antibiotics Anaphylaxis   Antimicrobials this admission: Rocephin 5/6 >>  Doxy 5/6 >> 5/7  No results found for this or any previous visit (from the past 240 hour(s)).  Thank you for allowing pharmacy to be a part of this patient's care.  Valrie Hart A 09/23/2017 10:52 AM

## 2017-09-23 NOTE — Progress Notes (Signed)
CTA results reviewed: No mesenteric ischemia.. She has RLL PNA already on treatment for. Diffuse pancreatic atrophy. AIR IN BLADDER WALL C/W EMPHYSEMATOUS CYSTITIS, NO EVIDENCE OF PERFORATION.   Patient is on Rocephin. U/A done but no culture.   Regarding rash possibly related to Creon. Creon contains porcine (pork) proteins as does Zenpep. 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 If she has it or allergy to pork in general, then there is no current pancreatic enzyme alternative containing lipase available that is not derived from pork.   Leanna Battles. Dixon Boos [facility] [phone] 5/8/20193:18 PM

## 2017-09-23 NOTE — Telephone Encounter (Signed)
PT has been approved for patient assistance for the Creon.

## 2017-09-23 NOTE — Progress Notes (Signed)
Subjective:  Complains of upper abdominal pain, N/V/D since stopping the Creon.   Objective: Vital signs in last 24 hours: Temp:  [98.1 F (36.7 C)-99 F (37.2 C)] 98.5 F (36.9 C) (05/08 0442) Pulse Rate:  [90-110] 110 (05/08 0442) Resp:  [17-22] 17 (05/08 0442) BP: (138-155)/(99-109) 150/106 (05/08 0442) SpO2:  [98 %-99 %] 98 % (05/08 0442) Last BM Date: 09/21/17 General:   Alert,  Well-developed, well-nourished, pleasant and cooperative in NAD. Appears like she does not feel well.  Head:  Normocephalic and atraumatic. Eyes:  Sclera clear, no icterus.  Abdomen:  Soft, mild epig tenderness and nondistended. Normal bowel sounds, without guarding, and without rebound.   Extremities:  Lower extremities with mild edema involving the feet, peeling, erythema noted on unwrapped right foot. Left foot with dressing. No edema above the feet.  Neurologic:  Alert and  oriented x4;  grossly normal neurologically. Skin:  Intact without significant lesions or rashes. Psych:  Alert and cooperative. Normal mood and affect.  Intake/Output from previous day: 05/07 0701 - 05/08 0700 In: 539.2 [P.O.:480; I.V.:59.2] Out: 3300 [Urine:3300] Intake/Output this shift: No intake/output data recorded.  Lab Results: CBC Recent Labs    09/21/17 2102 09/22/17 0513  WBC 7.1 8.5  HGB 9.2* 10.1*  HCT 26.8* 29.6*  MCV 99.3 98.3  PLT 307 375   BMET Recent Labs    09/21/17 2102 09/22/17 0513  NA 135 137  K 2.7* 3.4*  CL 99* 101  CO2 28 28  GLUCOSE 107* 117*  BUN 8 7  CREATININE 0.56 0.62  CALCIUM 7.9* 8.3*   LFTs Recent Labs    09/21/17 2102 09/22/17 0513  BILITOT 1.1 0.8  ALKPHOS 179* 193*  AST 40 40  ALT 25 28  PROT 5.2* 5.7*  ALBUMIN 1.8* 2.1*   Recent Labs    09/21/17 2102  LIPASE 17   PT/INR Recent Labs    09/22/17 1434  LABPROT 13.6  INR 1.05      Imaging Studies: Dg Chest 2 View  Result Date: 09/21/2017 CLINICAL DATA:  Fever. BILATERAL hand and foot skin changes  with erythema and blisters. EXAM: CHEST - 2 VIEW COMPARISON:  06/14/2017, 12/25/2015, 12/07/2011 and CTA chest 12/25/2015. FINDINGS: AP ERECT and LATERAL images were obtained. Cardiomediastinal silhouette unremarkable, unchanged. Hyperinflation with emphysematous changes in both lungs, unchanged. New focal airspace opacity in the RIGHT lower lobe, projecting immediately adjacent to the nipple shadow on the AP image. Lungs otherwise clear. No pleural effusions. No pneumothorax. Visualized bony thorax intact. Vascular embolization coils in the LEFT UPPER quadrant of the abdomen IMPRESSION: Focal pneumonia involving the RIGHT lower lobe, superimposed upon COPD/emphysema. Electronically Signed   By: Evangeline Dakin M.D.   On: 09/21/2017 21:08  [2 weeks]   Assessment: 52 year old female with history of alcohol abuse (started in 2017), severe pancreatic insufficiency, chronic abdominal pain, N/V, and weight loss (well documented down from 137 in 01/2017 to 105 pounds current, presenting to the ED with diffuse pruritis and rash. Concern has been raised that this is a reaction to Creon, as she states rash onset was 2 days after starting Creon. She also notes she was without this medication for one week but symptoms continued. Last dose yesterday. Per pharmacy, this would be rare with Creon. It is unclear if this is a true medication reaction. Unfortunately, we do not have Zenpep on formulary here in the hospital but would recommend trial with this.  N/V: chronic, worsened with stress. EGD completed during last  admission Jan 2019 with empiric dilation of possible proximal esophageal web, gastritis noted, normal duodenum, no evidence of abnormal villi. Dysphagia resolved. N/V improved on Creon but recurrent this admission since off creon.  Abdominal pain: at baseline. Complains of pp abd pain since off Creon. Gastritis on EGD. Chronic mesenteric ischemia remains in differential given weight loss but had improved on  Creon. Consider CTA if any concern.  Diarrhea: improved as outpatient on Creon, now with Creon discontinued due to possible side effect she has had recurrence of symptoms. Would trial Zenpep but not on formulary here.   Abnormal LFTs: in setting of chronic ETOH abuse, fatty liver, Hep B surface antibody, Hep B core antibody, Hep C antibody, and Hep A antibody all negativein Sept 2018. Mixed pattern elevation, with fluctuating transaminases and elevated alk phos. Bilirubin now normal, alk phos remains elevated. GGT elevated during last admission. Full serologies including AMA to assess for ?PBC especially in light of her presentation. May ultimately need liver biopsy.   Anemia: history of IDA. Multifactorial in setting of chronic ETOH abuse, malnutrition. Iron studies from Jan 2019 with elevated ferritin but this is acute phase reactant. No overt GI bleeding. EGD on file. Needs colonoscopy in near future once acute illness resolved. Hgb stable for now. Abnormal colon on CT in 05/2017.    Plan: 1. Low fat diet after CTA.  2. Pantoprazole not given due to prolonged QT interval warning. Advised patient not to take as an outpatient. Will discuss other options with pharmacy.  3. Zenpep as outpatient.  4. F/u pending labs.  5. Proceed with CTA abd given recurrent pp abd pain, diarrhea, prior abn colon on CT and ongoing weight loss. Discussed with patient.  6. Outpatient colonoscopy.   Laureen Ochs. Bernarda Caffey [facility] [phone] 5/8/20199:58 AM    LOS: 2 days
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
## 2017-09-24 LAB — ALPHA-1 ANTITRYPSIN PHENOTYPE: A1 ANTITRYPSIN SER: 151 mg/dL (ref 90–200)
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
## 2017-09-24 NOTE — Progress Notes (Signed)
Subjective:  Nausea without vomiting. Loose stool with meals. Upper abd discomfort. Denies h/o food allergies specifically to pork. Does eat pork regularly. No known tick exposure in the more recent past.   Objective: Vital signs in last 24 hours: Temp:  [98 F (36.7 C)-98.6 F (37 C)] 98.6 F (37 C) (05/09 0621) Pulse Rate:  [98-104] 104 (05/09 0621) Resp:  [16-20] 16 (05/09 0621) BP: (143-154)/(103-112) 144/103 (05/09 0621) SpO2:  [96 %-100 %] 96 % (05/09 0621) Last BM Date: 09/23/17 General:   Alert,  Well-developed, well-nourished, pleasant and cooperative in NAD Head:  Normocephalic and atraumatic. Eyes:  Sclera clear, no icterus.  Abdomen:  Soft, mild epig tenderness and nondistended. Normal bowel sounds, without guarding, and without rebound.   Extremities:  Without clubbing, deformity or edema. Neurologic:  Alert and  oriented x4;  grossly normal neurologically. Skin:  Intact without significant lesions or rashes. Psych:  Alert and cooperative. Normal mood and affect.  Intake/Output from previous day: 05/08 0701 - 05/09 0700 In: 600 [P.O.:600] Out: 2400 [Urine:2400] Intake/Output this shift: No intake/output data recorded.  Lab Results: CBC Recent Labs    09/21/17 2102 09/22/17 0513 09/23/17 0827  WBC 7.1 8.5 8.9  HGB 9.2* 10.1* 9.8*  HCT 26.8* 29.6* 28.8*  MCV 99.3 98.3 100.0  PLT 307 375 289   BMET Recent Labs    09/21/17 2102 09/22/17 0513 09/23/17 0826  NA 135 137 135  K 2.7* 3.4* 3.5  CL 99* 101 100*  CO2 _0 GLUCOSE 107* 117* 110*  BUN _1 CREATININE 0.56 0.62 0.55  CALCIUM 7.9* 8.3* 7.8*   LFTs Recent Labs    09/21/17 2102 09/22/17 0513  BILITOT 1.1 0.8  ALKPHOS 179* 193*  AST 40 40  ALT 25 28  PROT 5.2* 5.7*  ALBUMIN 1.8* 2.1*   Recent Labs    09/21/17 2102  LIPASE 17   PT/INR Recent Labs    09/22/17 1434  LABPROT 13.6  INR 1.05      Imaging Studies: Dg Chest 2 View  Result Date: 09/21/2017 CLINICAL DATA:   Fever. BILATERAL hand and foot skin changes with erythema and blisters. EXAM: CHEST - 2 VIEW COMPARISON:  06/14/2017, 12/25/2015, 12/07/2011 and CTA chest 12/25/2015. FINDINGS: AP ERECT and LATERAL images were obtained. Cardiomediastinal silhouette unremarkable, unchanged. Hyperinflation with emphysematous changes in both lungs, unchanged. New focal airspace opacity in the RIGHT lower lobe, projecting immediately adjacent to the nipple shadow on the AP image. Lungs otherwise clear. No pleural effusions. No pneumothorax. Visualized bony thorax intact. Vascular embolization coils in the LEFT UPPER quadrant of the abdomen IMPRESSION: Focal pneumonia involving the RIGHT lower lobe, superimposed upon COPD/emphysema. Electronically Signed   By: Evangeline Dakin M.D.   On: 09/21/2017 21:08   Ct Angio Abd/pel W/ And/or W/o  Result Date: 09/23/2017 CLINICAL DATA:  Abdominal pain, nausea and weight loss. EXAM: CT ANGIOGRAPHY ABDOMEN AND PELVIS WITH CONTRAST AND WITHOUT CONTRAST TECHNIQUE: Multidetector CT imaging of the abdomen and pelvis was performed using the standard protocol during bolus administration of intravenous contrast. Multiplanar reconstructed images and MIPs were obtained and reviewed to evaluate the vascular anatomy. CONTRAST:  160m ISOVUE-370 IOPAMIDOL (ISOVUE-370) INJECTION 76% COMPARISON:  CT of the abdomen and pelvis on 06/14/2017 FINDINGS: VASCULAR Aorta: The abdominal aorta demonstrates scattered atherosclerosis without evidence of aneurysm or significant stenosis. No aortic dissection identified. No evidence of vasculitis. Celiac: Normal patent celiac trunk and hepatic arteries. Continued presence coils in the splenic  artery. Some residual perfusion of the spleen is maintain by short gastric collaterals. SMA: Normally patent. Renals: Bilateral single renal arteries show normal patency. IMA: Normally patent. Inflow: Bilateral iliac arteries show normal patency. Proximal Outflow: Bilateral common  femoral arteries and femoral bifurcations are normally patent. Veins: Venous phase imaging shows normal patency of the systemic veins, portal vein and mesenteric veins. Renal veins are normally patent with normal variant circumaortic left renal vein anatomy. Review of the MIP images confirms the above findings. NON-VASCULAR Lower chest: The visualized lung bases demonstrate peripheral opacity at the right lung base suspicious for right lower lobe pneumonia. There is a small amount of associated right pleural fluid. Tiny amount left pleural fluid present at the left lung base. Hepatobiliary: Diffuse hepatic steatosis present without overt cirrhosis. The gallbladder is unremarkable. No biliary ductal dilatation identified. Pancreas: The pancreas is diffusely atrophic. No evidence pancreatic mass or inflammation. Spleen: Splenic atrophy with several calcified granulomata. Adrenals/Urinary Tract: Stable right adrenal nodule felt to most likely represent adenoma. Kidneys are normal, without renal calculi, focal lesion, or hydronephrosis. The bladder demonstrates pockets air circumferentially in the bladder wall consistent with emphysematous cystitis. No evidence to suggest bladder perforation. Stomach/Bowel: Bowel shows no evidence of obstruction or inflammation. No free air. Lymphatic: No enlarged lymph nodes identified. Reproductive: Status post hysterectomy. No adnexal masses. Other: Small amount of free fluid is present in the pelvis. No focal abscess identified. Musculoskeletal: No acute or significant osseous findings. IMPRESSION: VASCULAR Atherosclerosis of the abdominal aorta without evidence of aneurysm or stenosis. No evidence of mesenteric arterial occlusive disease. NON-VASCULAR 1. Suggestion of right lower lobe pneumonia with associated small amount of right pleural fluid. 2. 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 No evidence of bladder perforation. No evidence of pyelonephritis. 3.  Hepatic steatosis. 4. Diffuse pancreatic atrophy. 5. Stable right adrenal nodule most likely representing adenoma. Electronically Signed   By: Aletta Edouard M.D.   On: 09/23/2017 14:16  [2 weeks]   Assessment:   52 year old female with history of alcohol abuse (started in 2017), severe pancreatic insufficiency, chronic abdominal pain, N/V, and weight loss (well documented down from 137 in 01/2017 to 105 pounds current, presenting to the ED with diffuse pruritis and rash. Concern has been raised that this is a reaction to Creon, as she states rash onset was 2 days after starting Creon. She also notes she was without this medication for one week but symptoms continued. Last dose yesterday. Per pharmacy, this would be rare with Creon. It is unclear if this is a true medication reaction. Unfortunately, we do not have Zenpep on formulary here in the hospital but would recommend trial with this. Will rule out Alpha-Gal syndrome, send out lab collected today. Discussed with patient, would consider holding on ALL pancreatic enzymes until results are available as ALL available pancreatic enzymes are made from porcine proteins.   N/V: chronic, worsened with stress. EGD completed during last admission Jan 2019 with empiric dilation of possible proximal esophageal web, gastritis noted, normal duodenum, no evidence of abnormal villi. Dysphagia resolved. N/V improved on Creon but recurrent this admission since off creon.  Abdominal pain: at baseline. Complains of pp abd pain since off Creon. Gastritis on EGD. CTA without mesenteric ischemia.  Diarrhea: improved as outpatient on Creon, now with Creon discontinued due to possible side effect she has had recurrence of symptoms. Hold on pancreatic enzymes until Alpha-Gal results available. Avoid fatty meals.   Abnormal LFTs: in setting of chronic  ETOH abuse, fatty liver,Hep B surface antibody, Hep B core antibody, Hep C antibody, and Hep A antibody all  negativein Sept 2018. Mixed pattern elevation, with fluctuating transaminases and elevated alk phos. Bilirubin now normal, alk phos remains elevated. GGT elevated during last admission. Full serologies including AMA to assess for ?PBC especially in light of her presentation. May ultimately need liver biopsy.   Anemia: history of IDA. Multifactorial in setting of chronic ETOH abuse, malnutrition. Iron studies this admission not c/w IDA. Elevated ferritin but this is likely elevated as acute phase reactant. No overt GI bleeding. EGD on file. Needs colonoscopy in near future once acute illness resolved. Hgb stable for now.Abnormal colon on CT in 05/2017.   Plan: 1. Low fat diet.  2. Pantoprazole not given due to prolonged QT interval warning. Advised patient not to take as an outpatient. 3. F/u pending labs.  4. Outpatient colonoscopy. 5. Hold on pancreatic enzymes until Alpha-Gal results available.   Laureen Ochs. Bernarda Caffey [facility] [phone] 5/9/20199:43 AM     LOS: 3 days

## 2017-09-25 DIAGNOSIS — E43 Unspecified severe protein-calorie malnutrition: Secondary | ICD-10-CM

## 2017-09-25 DIAGNOSIS — J189 Pneumonia, unspecified organism: Principal | ICD-10-CM

## 2017-09-25 DIAGNOSIS — E46 Unspecified protein-calorie malnutrition: Secondary | ICD-10-CM

## 2017-09-25 LAB — MISC LABCORP TEST (SEND OUT): LABCORP TEST CODE: 807003

## 2017-09-25 MED ORDER — HYDROXYZINE HCL 25 MG PO TABS: 25.0000 mg | ORAL_TABLET | Freq: Three times a day (TID) | ORAL | 0 refills | Status: AC | PRN

## 2017-09-25 NOTE — Progress Notes (Signed)
Subjective: Today she states abdominal pain is persistent 8/10, same versus slightly worse compared to yesterday.  Continued nausea, some vomiting yesterday, none today. Stools this morning were not normal, but not diarrhea; more soft/formed. Has one pancake today. Still with rash. No other GI complaints.  Objective: Vital signs in last 24 hours: Temp:  [98.7 F (37.1 C)-99.4 F (37.4 C)] 98.7 F (37.1 C) (05/10 0609) Pulse Rate:  [98-110] 103 (05/10 0609) Resp:  [14-16] 14 (05/10 0609) BP: (138-156)/(94-105) 156/103 (05/10 0609) SpO2:  [97 %-98 %] 98 % (05/10 0609) Last BM Date: 09/24/17 General:   Alert and oriented, pleasant Head:  Normocephalic and atraumatic. Eyes:  No icterus, sclera clear. Conjuctiva pink.  Heart:  S1, S2 present, no murmurs noted.  Lungs: Clear to auscultation bilaterally, without wheezing, rales, or rhonchi.  Abdomen:  Bowel sounds present, soft, non-tender, non-distended. No HSM or hernias noted. No rebound or guarding. No masses appreciated  Msk:  Symmetrical without gross deformities. Pulses:  Normal bilateral DP pulses noted. Extremities:  Without clubbing. Some noted edema left ankle/foot. Neurologic:  Alert and  oriented x4;  grossly normal neurologically. Skin:  Persistent rash.  Psych:  Alert and cooperative. Normal mood and affect.  Intake/Output from previous day: 05/09 0701 - 05/10 0700 In: 480 [P.O.:480] Out: -  Intake/Output this shift: No intake/output data recorded.  Lab Results: Recent Labs    09/23/17 0827  WBC 8.9  HGB 9.8*  HCT 28.8*  PLT 289   BMET Recent Labs    09/23/17 0826  NA 135  K 3.5  CL 100*  CO2 27  GLUCOSE 110*  BUN 7  CREATININE 0.55  CALCIUM 7.8*   LFT No results for input(s): PROT, ALBUMIN, AST, ALT, ALKPHOS, BILITOT, BILIDIR, IBILI in the last 72 hours. PT/INR Recent Labs    09/22/17 1434  LABPROT 13.6  INR 1.05   Hepatitis Panel No results for input(s): HEPBSAG, HCVAB, HEPAIGM,  HEPBIGM in the last 72 hours.   Studies/Results: Ct Angio Abd/pel W/ And/or W/o  Result Date: 09/23/2017 CLINICAL DATA:  Abdominal pain, nausea and weight loss. EXAM: CT ANGIOGRAPHY ABDOMEN AND PELVIS WITH CONTRAST AND WITHOUT CONTRAST TECHNIQUE: Multidetector CT imaging of the abdomen and pelvis was performed using the standard protocol during bolus administration of intravenous contrast. Multiplanar reconstructed images and MIPs were obtained and reviewed to evaluate the vascular anatomy. CONTRAST:  15m ISOVUE-370 IOPAMIDOL (ISOVUE-370) INJECTION 76% COMPARISON:  CT of the abdomen and pelvis on 06/14/2017 FINDINGS: VASCULAR Aorta: The abdominal aorta demonstrates scattered atherosclerosis without evidence of aneurysm or significant stenosis. No aortic dissection identified. No evidence of vasculitis. Celiac: Normal patent celiac trunk and hepatic arteries. Continued presence coils in the splenic artery. Some residual perfusion of the spleen is maintain by short gastric collaterals. SMA: Normally patent. Renals: Bilateral single renal arteries show normal patency. IMA: Normally patent. Inflow: Bilateral iliac arteries show normal patency. Proximal Outflow: Bilateral common femoral arteries and femoral bifurcations are normally patent. Veins: Venous phase imaging shows normal patency of the systemic veins, portal vein and mesenteric veins. Renal veins are normally patent with normal variant circumaortic left renal vein anatomy. Review of the MIP images confirms the above findings. NON-VASCULAR Lower chest: The visualized lung bases demonstrate peripheral opacity at the right lung base suspicious for right lower lobe pneumonia. There is a small amount of associated right pleural fluid. Tiny amount left pleural fluid present at the left lung base. Hepatobiliary: Diffuse hepatic steatosis present without overt cirrhosis.  The gallbladder is unremarkable. No biliary ductal dilatation identified. Pancreas: The  pancreas is diffusely atrophic. No evidence pancreatic mass or inflammation. Spleen: Splenic atrophy with several calcified granulomata. Adrenals/Urinary Tract: Stable right adrenal nodule felt to most likely represent adenoma. Kidneys are normal, without renal calculi, focal lesion, or hydronephrosis. The bladder demonstrates pockets air circumferentially in the bladder wall consistent with emphysematous cystitis. No evidence to suggest bladder perforation. Stomach/Bowel: Bowel shows no evidence of obstruction or inflammation. No free air. Lymphatic: No enlarged lymph nodes identified. Reproductive: Status post hysterectomy. No adnexal masses. Other: Small amount of free fluid is present in the pelvis. No focal abscess identified. Musculoskeletal: No acute or significant osseous findings. IMPRESSION: VASCULAR Atherosclerosis of the abdominal aorta without evidence of aneurysm or stenosis. No evidence of mesenteric arterial occlusive disease. NON-VASCULAR 1. Suggestion of right lower lobe pneumonia with associated small amount of right pleural fluid. 2. 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 No evidence of bladder perforation. No evidence of pyelonephritis. 3. Hepatic steatosis. 4. Diffuse pancreatic atrophy. 5. Stable right adrenal nodule most likely representing adenoma. Electronically Signed   By: Aletta Edouard M.D.   On: 09/23/2017 14:16    Assessment: 52 year old female with history ofalcohol abuse (started in 2017),severe pancreatic insufficiency, chronic abdominal pain, N/V, and weight loss (well documented down from 137 in 01/2017 to 105 pounds current, presenting to the ED with diffuse pruritis and rash. Concern has been raised that this is a reaction to Creon, as she states rash onset was 2 days after starting Creon. She also notes she was without this medication for one week but symptoms continued. Perpharmacy, this would be rare with Creon. It is unclear if this is a true  medication reaction. Unfortunately, we do not have Zenpep on formulary here in the hospital but would recommend trial with this. Will rule out Alpha-Gal syndrome, send out lab collected today. Discussed with patient, would consider holding on ALL pancreatic enzymes until results are available as ALL available pancreatic enzymes are made from porcine proteins.   N/V: chronic, worsened with stress. EGD completed during last admission Jan 2019 with empiric dilation of possible proximal esophageal web, gastritis noted, normal duodenum, no evidence of abnormal villi. Dysphagia resolved. N/V improved on Creon. Today some persistent nausea, no vomiting today. Still of pancreatic enzymes pending alpha-ga; results.  Abdominal pain: at baseline.Complains of pp abd pain since off Creon.Gastritis on EGD.CTA without mesenteric ischemia. Persistent pain today, query acute on chronic gastritis. On PPI daily.  Diarrhea: improved as outpatient on Creon. Hold on pancreatic enzymes until Alpha-Gal results available. Avoid fatty meals. Milk of Mag has helped, soft/formed stool today.  Abnormal LFTs: in setting ofchronic ETOH abuse, fatty liver,Hep B surface antibody, Hep B core antibody, Hep C antibody, and Hep A antibody all negativein Sept 2018. Mixed pattern elevation, with fluctuating transaminases and elevated alk phos. Bilirubin now normal, alk phos remains elevated. GGT elevated during last admission.Full serologies including AMA to assess for ?PBC especially in light of her presentation. Her ceruloplasmin is low. Likely need for liver bx as outpatient.  Anemia: history of IDA. Multifactorial in setting of chronic ETOH abuse, malnutrition. Iron studies this admission not c/w IDA. Elevated ferritin but this is likely elevated as acute phase reactant. No overt GI bleeding. EGD on file. Needs colonoscopy in near future once acute illness resolved. Hgb stable for now.Abnormal colon on CT in  05/2017.   Plan: 1. Watch for alpha gal results 2. Supportive measures 3.  Increase Protonix to bid ac 4. If alpha gal negative, restart pancreatic enzymes 5. Colonoscopy as outpatient 6. Consider liver bx as outpatient 7. Planned follow-up outpatient GI 4-6 weeks after d/c.   Thank you for allowing [place] to participate in the care of Kendra Drape, DNP, AGNP-C Adult & Gerontological Nurse Practitioner [facility]     LOS: 4 days    09/25/2017, 9:37 AM

## 2017-09-25 NOTE — Progress Notes (Signed)
IVs removed, patient tolerated well, 2x2 gauze and paper tape applied to site. Reviewed discharge information/AVS with patient who verbalized understanding.  Patient awaiting arrival of family to transport home.
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
## 2017-10-05 NOTE — Progress Notes (Signed)
Left message for pt to call.

## 2017-10-07 ENCOUNTER — Encounter: Payer: Self-pay | Admitting: Gastroenterology

## 2017-10-07 ENCOUNTER — Other Ambulatory Visit: Payer: Self-pay | Admitting: *Deleted

## 2017-10-07 DIAGNOSIS — R21 Rash and other nonspecific skin eruption: Secondary | ICD-10-CM

## 2017-10-07 NOTE — Progress Notes (Signed)
PATIENT SCHEDULED AND LETTER SENT  °

## 2017-10-07 NOTE — Progress Notes (Signed)
PT is aware and said OK to make referral, although her rash is almost gone now. Forwarding to [facility] and to [facility] to schedule appt.

## 2017-10-07 NOTE — Progress Notes (Signed)
PT is aware of results and plan. She said she does not have an appt with dermatology yet, although she was told at the hospital that they would make her an appt with a dermatologist.  She said her stomach was hurting so bad yesterday she took Creon and she took a total of 3 capsules yesterday. She said it helped her abdominal pain and she had not other problems with it.   Forwarding FYI to Tana Coast, PA.

## 2017-10-14 ENCOUNTER — Telehealth: Payer: Self-pay | Admitting: *Deleted

## 2017-10-14 NOTE — Telephone Encounter (Signed)
Patient was referred to Dr. Scharlene Gloss office (dermatology) for rash. They can see her on 10/20/17 at 11:40am. Patient does not have insurance and will be required to pay upfront for the visit.  Called patient and made aware. She voiced understanding. She was provided with Dr. Scharlene Gloss number. Nothing further needed

## 2017-11-26 ENCOUNTER — Ambulatory Visit: Payer: Self-pay | Admitting: Gastroenterology

## 2019-08-05 ENCOUNTER — Other Ambulatory Visit (HOSPITAL_COMMUNITY): Payer: Self-pay | Admitting: Nephrology

## 2019-08-05 DIAGNOSIS — N1832 Chronic kidney disease, stage 3b: Secondary | ICD-10-CM

## 2019-08-09 ENCOUNTER — Other Ambulatory Visit (HOSPITAL_COMMUNITY)
Admission: RE | Admit: 2019-08-09 | Discharge: 2019-08-09 | Disposition: A | Discharge status: Home / Self Care | Payer: Medicaid Other | Source: Ambulatory Visit | Attending: Nephrology | Admitting: Nephrology

## 2019-08-09 DIAGNOSIS — N1832 Chronic kidney disease, stage 3b: Secondary | ICD-10-CM | POA: Insufficient documentation

## 2019-08-09 DIAGNOSIS — E871 Hypo-osmolality and hyponatremia: Secondary | ICD-10-CM | POA: Diagnosis present

## 2019-08-09 DIAGNOSIS — E211 Secondary hyperparathyroidism, not elsewhere classified: Secondary | ICD-10-CM | POA: Diagnosis present

## 2019-08-09 DIAGNOSIS — I129 Hypertensive chronic kidney disease with stage 1 through stage 4 chronic kidney disease, or unspecified chronic kidney disease: Secondary | ICD-10-CM | POA: Diagnosis present

## 2019-08-09 DIAGNOSIS — D638 Anemia in other chronic diseases classified elsewhere: Secondary | ICD-10-CM | POA: Insufficient documentation

## 2019-08-09 LAB — COMPREHENSIVE METABOLIC PANEL
ALT: 17 U/L (ref 0–44)
AST: 23 U/L (ref 15–41)
Albumin: 2.7 g/dL — ABNORMAL LOW (ref 3.5–5.0)
Alkaline Phosphatase: 153 U/L — ABNORMAL HIGH (ref 38–126)
Anion gap: 12 (ref 5–15)
BUN: 12 mg/dL (ref 6–20)
CO2: 21 mmol/L — ABNORMAL LOW (ref 22–32)
Calcium: 8.5 mg/dL — ABNORMAL LOW (ref 8.9–10.3)
Chloride: 97 mmol/L — ABNORMAL LOW (ref 98–111)
Creatinine, Ser: 1.03 mg/dL — ABNORMAL HIGH (ref 0.44–1.00)
GFR calc Af Amer: >60 mL/min (ref >60)
GFR calc non Af Amer: >60 mL/min (ref >60)
Glucose, Bld: 112 mg/dL — ABNORMAL HIGH (ref 70–99)
Potassium: 3.5 mmol/L (ref 3.5–5.1)
Sodium: 130 mmol/L — ABNORMAL LOW (ref 135–145)
Total Bilirubin: 0.5 mg/dL (ref 0.3–1.2)
Total Protein: 6.2 g/dL — ABNORMAL LOW (ref 6.5–8.1)

## 2019-08-09 LAB — PROTEIN / CREATININE RATIO, URINE
Creatinine, Urine: 42.53 mg/dL
Protein Creatinine Ratio: 0.47 mg/mg{Cre} — ABNORMAL HIGH (ref 0.00–0.15)
Total Protein, Urine: 20 mg/dL

## 2019-08-09 LAB — CBC WITH DIFFERENTIAL/PLATELET
Abs Immature Granulocytes: 0.07 10*3/uL (ref 0.00–0.07)
Basophils Absolute: 0 10*3/uL (ref 0.0–0.1)
Basophils Relative: 0 %
Eosinophils Absolute: 0 10*3/uL (ref 0.0–0.5)
Eosinophils Relative: 0 %
HCT: 26.9 % — ABNORMAL LOW (ref 36.0–46.0)
Hemoglobin: 9.1 g/dL — ABNORMAL LOW (ref 12.0–15.0)
Immature Granulocytes: 1 %
Lymphocytes Relative: 11 %
Lymphs Abs: 0.9 10*3/uL (ref 0.7–4.0)
MCH: 31.8 pg (ref 26.0–34.0)
MCHC: 33.8 g/dL (ref 30.0–36.0)
MCV: 94.1 fL (ref 80.0–100.0)
Monocytes Absolute: 0.3 10*3/uL (ref 0.1–1.0)
Monocytes Relative: 3 %
Neutro Abs: 6.7 10*3/uL (ref 1.7–7.7)
Neutrophils Relative %: 85 %
Platelets: 285 10*3/uL (ref 150–400)
RBC: 2.86 MIL/uL — ABNORMAL LOW (ref 3.87–5.11)
RDW: 14 % (ref 11.5–15.5)
WBC: 7.9 10*3/uL (ref 4.0–10.5)
nRBC: 0.5 % — ABNORMAL HIGH (ref 0.0–0.2)

## 2019-08-09 LAB — CREATININE CLEARANCE, URINE, 24 HOUR
Collection Interval-CRCL: 24 hours
Creatinine Clearance: 44 mL/min — ABNORMAL LOW (ref 75–115)
Creatinine, 24H Ur: 648 mg/d (ref 600–1800)
Creatinine, Urine: 43.17 mg/dL
Urine Total Volume-CRCL: 1500 mL

## 2019-08-09 LAB — VITAMIN B12: Vitamin B-12: 480 pg/mL (ref 180–914)

## 2019-08-09 LAB — PROTEIN, URINE, 24 HOUR
Collection Interval-UPROT: 24 hours
Protein, 24H Urine: 300 mg/d — ABNORMAL HIGH (ref 50–100)
Protein, Urine: 20 mg/dL
Urine Total Volume-UPROT: 1500 mL

## 2019-08-09 LAB — URIC ACID: Uric Acid, Serum: 6.5 mg/dL (ref 2.5–7.1)

## 2019-08-09 LAB — IRON AND TIBC
Iron: 104 ug/dL (ref 28–170)
Saturation Ratios: 83 % — ABNORMAL HIGH (ref 10.4–31.8)
TIBC: 125 ug/dL — ABNORMAL LOW (ref 250–450)
UIBC: 21 ug/dL

## 2019-08-09 LAB — VITAMIN D 25 HYDROXY (VIT D DEFICIENCY, FRACTURES): Vit D, 25-Hydroxy: 13.16 ng/mL — ABNORMAL LOW (ref 30–100)

## 2019-08-09 LAB — HEMOGLOBIN A1C
Hgb A1c MFr Bld: 5.4 % (ref 4.8–5.6)
Mean Plasma Glucose: 108.28 mg/dL

## 2019-08-09 LAB — HEPATITIS B SURFACE ANTIGEN: Hepatitis B Surface Ag: NONREACTIVE

## 2019-08-09 LAB — MAGNESIUM: Magnesium: 1.4 mg/dL — ABNORMAL LOW (ref 1.7–2.4)

## 2019-08-09 LAB — FERRITIN: Ferritin: 482 ng/mL — ABNORMAL HIGH (ref 11–307)

## 2019-08-09 LAB — PHOSPHORUS: Phosphorus: 2.3 mg/dL — ABNORMAL LOW (ref 2.5–4.6)

## 2019-08-10 ENCOUNTER — Ambulatory Visit (HOSPITAL_COMMUNITY): Admission: RE | Admit: 2019-08-10 | Payer: Self-pay | Source: Ambulatory Visit

## 2019-08-10 ENCOUNTER — Encounter (HOSPITAL_COMMUNITY): Payer: Self-pay

## 2019-08-10 LAB — ANCA TITERS
Atypical P-ANCA titer: <1:20 {titer}
C-ANCA: <1:20 {titer}
P-ANCA: <1:20 {titer}

## 2019-08-10 LAB — HEPATITIS B SURFACE ANTIBODY,QUALITATIVE: Hep B S Ab: NONREACTIVE

## 2019-08-10 LAB — UIFE/LIGHT CHAINS/TP QN, 24-HR UR
FR KAPPA LT CH,24HR: 297.6 mg/24 hr
FR LAMBDA LT CH,24HR: 40.04 mg/24 hr
Free Kappa Lt Chains,Ur: 198.4 mg/L — ABNORMAL HIGH (ref 0.63–113.79)
Free Kappa/Lambda Ratio: 7.43 (ref 1.03–31.76)
Free Lambda Lt Chains,Ur: 26.69 mg/L — ABNORMAL HIGH (ref 0.47–11.77)
Total Protein, Urine-Ur/day: 225 mg/24 hr — ABNORMAL HIGH (ref 30–150)
Total Protein, Urine: 15 mg/dL
Total Volume: 1500

## 2019-08-10 LAB — KAPPA/LAMBDA LIGHT CHAINS
Kappa free light chain: 52.2 mg/L — ABNORMAL HIGH (ref 3.3–19.4)
Kappa, lambda light chain ratio: 0.82 (ref 0.26–1.65)
Lambda free light chains: 63.6 mg/L — ABNORMAL HIGH (ref 5.7–26.3)

## 2019-08-10 LAB — PTH, INTACT AND CALCIUM
Calcium, Total (PTH): 8.4 mg/dL — ABNORMAL LOW (ref 8.7–10.2)
PTH: 65 pg/mL (ref 15–65)

## 2019-08-10 LAB — ANA: Anti Nuclear Antibody (ANA): NEGATIVE

## 2019-08-10 LAB — HEPATITIS C ANTIBODY: HCV Ab: NONREACTIVE

## 2019-08-10 LAB — C3 COMPLEMENT: C3 Complement: 81 mg/dL — ABNORMAL LOW (ref 82–167)

## 2019-08-10 LAB — GLOMERULAR BASEMENT MEMBRANE ANTIBODIES: GBM Ab: 2 units (ref 0–20)

## 2019-08-10 LAB — C4 COMPLEMENT: Complement C4, Body Fluid: 22 mg/dL (ref 12–38)

## 2019-08-11 LAB — RNA QUALITATIVE: HIV 1 RNA Qualitative: 1

## 2019-08-11 LAB — MISC LABCORP TEST (SEND OUT): Labcorp test code: 141330

## 2019-08-11 LAB — PROTEIN ELECTROPHORESIS, SERUM
A/G Ratio: 1 (ref 0.7–1.7)
Albumin ELP: 3 g/dL (ref 2.9–4.4)
Alpha-1-Globulin: 0.3 g/dL (ref 0.0–0.4)
Alpha-2-Globulin: 0.7 g/dL (ref 0.4–1.0)
Beta Globulin: 0.8 g/dL (ref 0.7–1.3)
Gamma Globulin: 1.1 g/dL (ref 0.4–1.8)
Globulin, Total: 2.9 g/dL (ref 2.2–3.9)
Total Protein ELP: 5.9 g/dL — ABNORMAL LOW (ref 6.0–8.5)

## 2019-08-11 LAB — HIV-1/2 AB - DIFFERENTIATION
HIV 1 Ab: NEGATIVE
HIV 2 Ab: NEGATIVE
Note: NEGATIVE

## 2019-08-13 LAB — HEPATITIS C GENOTYPE

## 2019-10-25 MED ORDER — DULOXETINE HCL 30 MG PO CPEP
60.00 | ORAL_CAPSULE | ORAL | Status: DC
Start: 2019-10-25 — End: 2019-10-25

## 2019-10-25 MED ORDER — QUETIAPINE FUMARATE 25 MG PO TABS
12.50 | ORAL_TABLET | ORAL | Status: DC
Start: 2019-10-25 — End: 2019-10-25

## 2019-10-25 MED ORDER — LEVOTHYROXINE SODIUM 25 MCG PO TABS
25.00 | ORAL_TABLET | ORAL | Status: DC
Start: 2019-10-26 — End: 2019-10-25

## 2019-10-25 MED ORDER — DSS 100 MG PO CAPS
100.00 | ORAL_CAPSULE | ORAL | Status: DC
Start: ? — End: 2019-10-25

## 2019-10-25 MED ORDER — ALPRAZOLAM 1 MG PO TABS
1.00 | ORAL_TABLET | ORAL | Status: DC
Start: ? — End: 2019-10-25

## 2019-10-25 MED ORDER — ACETAMINOPHEN 325 MG PO TABS
325.00 | ORAL_TABLET | ORAL | Status: DC
Start: ? — End: 2019-10-25

## 2019-10-25 MED ORDER — ALBUTEROL SULFATE (2.5 MG/3ML) 0.083% IN NEBU
2.50 | INHALATION_SOLUTION | RESPIRATORY_TRACT | Status: DC
Start: ? — End: 2019-10-25

## 2019-10-25 MED ORDER — GUAIFENESIN ER 600 MG PO TB12
600.00 | ORAL_TABLET | ORAL | Status: DC
Start: 2019-10-25 — End: 2019-10-25

## 2019-10-25 MED ORDER — TRAMADOL HCL 50 MG PO TABS
50.00 | ORAL_TABLET | ORAL | Status: DC
Start: ? — End: 2019-10-25

## 2019-10-25 MED ORDER — MAGNESIUM OXIDE 400 MG PO TABS
400.00 | ORAL_TABLET | ORAL | Status: DC
Start: 2019-10-25 — End: 2019-10-25

## 2019-10-25 MED ORDER — ROPINIROLE HCL 1 MG PO TABS
1.00 | ORAL_TABLET | ORAL | Status: DC
Start: 2019-10-25 — End: 2019-10-25

## 2019-10-25 MED ORDER — FOLIC ACID 1 MG PO TABS
1.00 | ORAL_TABLET | ORAL | Status: DC
Start: 2019-10-25 — End: 2019-10-25

## 2019-10-25 MED ORDER — NICOTINE 21 MG/24HR TD PT24
1.00 | MEDICATED_PATCH | TRANSDERMAL | Status: DC
Start: ? — End: 2019-10-25

## 2019-10-25 MED ORDER — MIRTAZAPINE 15 MG PO TABS
15.00 | ORAL_TABLET | ORAL | Status: DC
Start: 2019-10-25 — End: 2019-10-25

## 2019-10-25 MED ORDER — AMLODIPINE BESYLATE 5 MG PO TABS
10.00 | ORAL_TABLET | ORAL | Status: DC
Start: 2019-10-25 — End: 2019-10-25

## 2019-10-25 MED ORDER — NALOXONE HCL 4 MG/10ML IJ SOLN
0.10 | INTRAMUSCULAR | Status: DC
Start: ? — End: 2019-10-25

## 2019-10-25 MED ORDER — CARVEDILOL 12.5 MG PO TABS
12.50 | ORAL_TABLET | ORAL | Status: DC
Start: 2019-10-25 — End: 2019-10-25

## 2019-10-25 MED ORDER — GUAIFENESIN 100 MG/5ML PO SYRP
200.00 | ORAL_SOLUTION | ORAL | Status: DC
Start: ? — End: 2019-10-25

## 2019-10-25 MED ORDER — THIAMINE HCL 100 MG PO TABS
100.00 | ORAL_TABLET | ORAL | Status: DC
Start: 2019-10-25 — End: 2019-10-25

## 2019-10-25 MED ORDER — FLUTICASONE FUROATE-VILANTEROL 200-25 MCG/INH IN AEPB
1.00 | INHALATION_SPRAY | RESPIRATORY_TRACT | Status: DC
Start: 2019-10-26 — End: 2019-10-25

## 2019-10-25 MED ORDER — THERA-M PO TABS
1.00 | ORAL_TABLET | ORAL | Status: DC
Start: 2019-10-25 — End: 2019-10-25

## 2019-10-25 MED ORDER — MAGNESIUM HYDROXIDE 400 MG/5ML PO SUSP
30.00 | ORAL | Status: DC
Start: ? — End: 2019-10-25

## 2019-10-25 MED ORDER — VENLAFAXINE HCL ER 37.5 MG PO CP24
150.00 | ORAL_CAPSULE | ORAL | Status: DC
Start: 2019-10-25 — End: 2019-10-25

## 2019-10-25 MED ORDER — IBUPROFEN 400 MG PO TABS
400.00 | ORAL_TABLET | ORAL | Status: DC
Start: ? — End: 2019-10-25

## 2019-10-25 MED ORDER — HYDRALAZINE HCL 20 MG/ML IJ SOLN
10.00 | INTRAMUSCULAR | Status: DC
Start: ? — End: 2019-10-25

## 2019-10-25 MED ORDER — PANTOPRAZOLE SODIUM 40 MG PO TBEC
40.00 | DELAYED_RELEASE_TABLET | ORAL | Status: DC
Start: 2019-10-25 — End: 2019-10-25

## 2019-10-25 MED ORDER — ONDANSETRON HCL 4 MG PO TABS
4.00 | ORAL_TABLET | ORAL | Status: DC
Start: ? — End: 2019-10-25

## 2019-10-25 MED ORDER — IPRATROPIUM-ALBUTEROL 0.5-2.5 (3) MG/3ML IN SOLN
3.00 | RESPIRATORY_TRACT | Status: DC
Start: 2019-10-25 — End: 2019-10-25

## 2019-10-25 MED ORDER — CALCIUM CARBONATE 1250 (500 CA) MG PO CHEW
CHEWABLE_TABLET | ORAL | Status: DC
Start: ? — End: 2019-10-25

## 2019-10-26 ENCOUNTER — Institutional Professional Consult (permissible substitution): Payer: Medicaid Other | Admitting: Pulmonary Disease

## 2020-01-02 ENCOUNTER — Institutional Professional Consult (permissible substitution): Payer: Medicaid Other | Admitting: Pulmonary Disease

## 2020-01-10 IMAGING — DX DG CHEST 2V
2 series · 2 of 2 positions shown · non-contrast
Comparison: 06/14/2017, 12/25/2015, 12/07/2011 and CTA chest
12/25/2015.

CLINICAL DATA: Fever. BILATERAL hand and foot skin changes with
erythema and blisters.

EXAM:
CHEST - 2 VIEW

[chest lat]
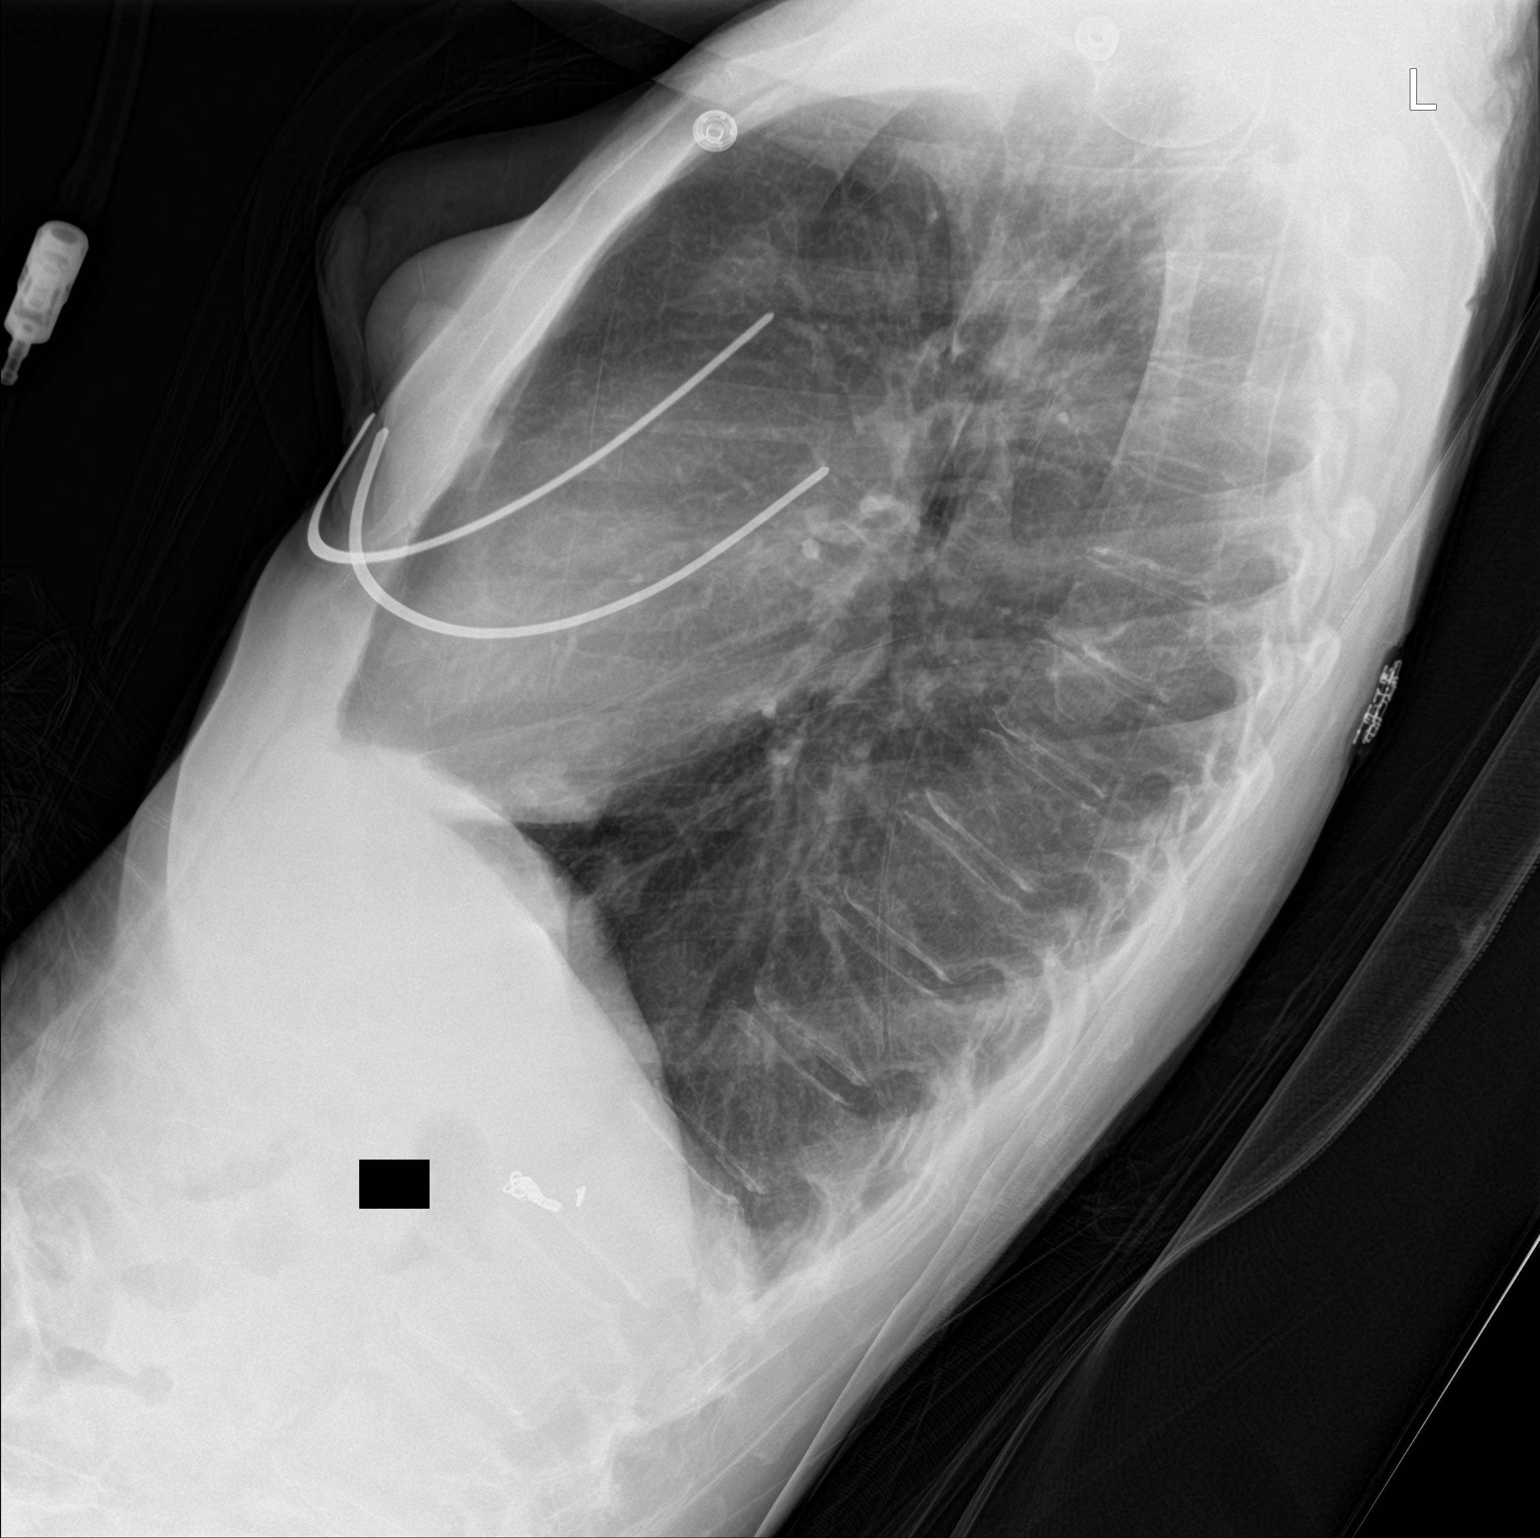

[chest ap]
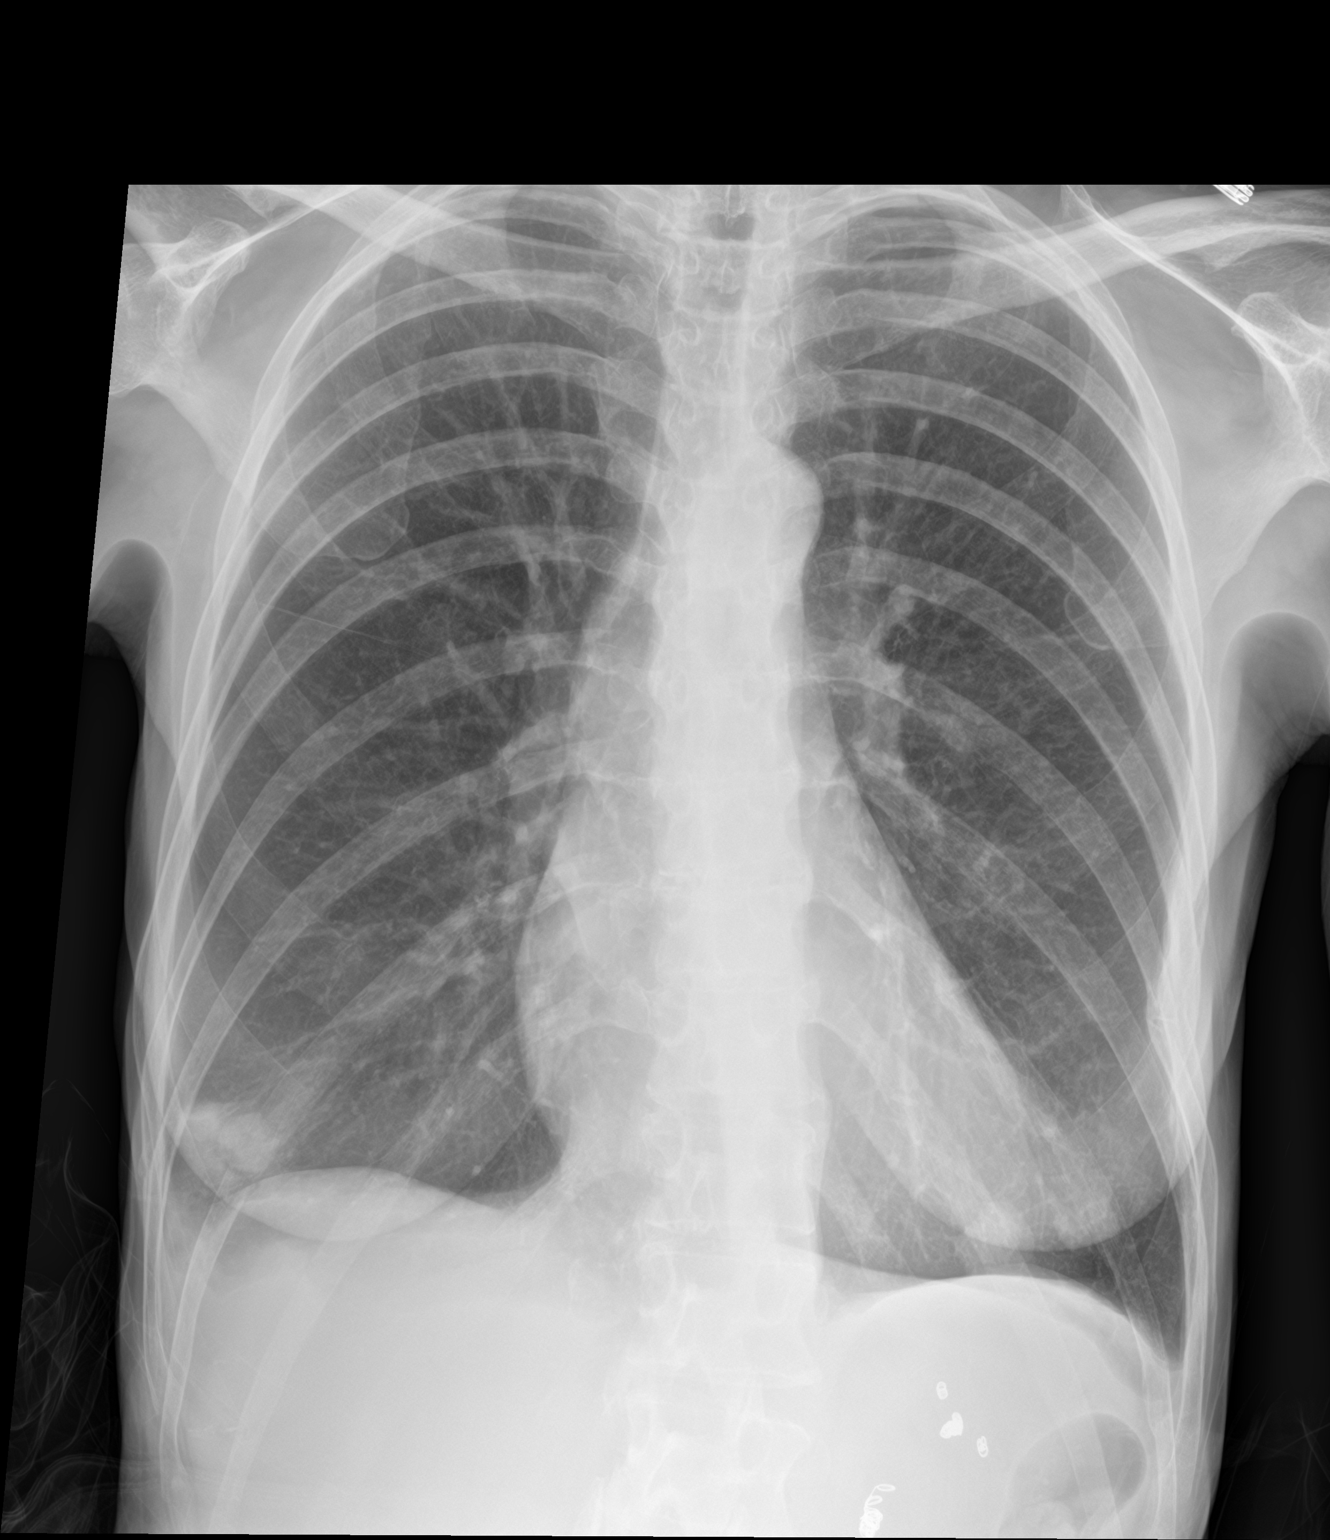

[2 of 2 positions shown; findings below may reference images not displayed]

FINDINGS: AP ERECT and LATERAL images were obtained. Cardiomediastinal
silhouette unremarkable, unchanged. Hyperinflation with
emphysematous changes in both lungs, unchanged. New focal airspace
opacity in the RIGHT lower lobe, projecting immediately adjacent to
the nipple shadow on the AP image. Lungs otherwise clear. No pleural
effusions. No pneumothorax. Visualized bony thorax intact. Vascular
embolization coils in the LEFT UPPER quadrant of the abdomen
IMPRESSION: Focal pneumonia involving the RIGHT lower lobe, superimposed upon
COPD/emphysema.

## 2020-03-02 ENCOUNTER — Institutional Professional Consult (permissible substitution): Payer: Self-pay | Admitting: Pulmonary Disease

## 2020-04-23 ENCOUNTER — Institutional Professional Consult (permissible substitution): Payer: Self-pay | Admitting: Pulmonary Disease

## 2020-07-17 DEATH — deceased
